# Patient Record
Sex: Female | Born: 1980 | Race: Black or African American | Hispanic: No | Marital: Married | Smoking: Never smoker
Health system: Southern US, Community
[De-identification: ages and names within clinical notes are randomized; demographics above are authoritative.]

## PROBLEM LIST (undated history)

## (undated) DIAGNOSIS — I10 Essential (primary) hypertension: Secondary | ICD-10-CM

## (undated) DIAGNOSIS — J45909 Unspecified asthma, uncomplicated: Secondary | ICD-10-CM

## (undated) DIAGNOSIS — D649 Anemia, unspecified: Secondary | ICD-10-CM

## (undated) DIAGNOSIS — R011 Cardiac murmur, unspecified: Secondary | ICD-10-CM

## (undated) HISTORY — PX: NASAL SEPTUM SURGERY: SHX37

---
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
## 2001-01-24 ENCOUNTER — Emergency Department (HOSPITAL_COMMUNITY): Admission: EM | Admit: 2001-01-24 | Discharge: 2001-01-24 | Payer: Self-pay | Admitting: *Deleted

## 2003-06-29 ENCOUNTER — Emergency Department (HOSPITAL_COMMUNITY): Admission: EM | Admit: 2003-06-29 | Discharge: 2003-06-29 | Payer: Self-pay | Admitting: Emergency Medicine

## 2003-07-03 ENCOUNTER — Emergency Department (HOSPITAL_COMMUNITY): Admission: EM | Admit: 2003-07-03 | Discharge: 2003-07-03 | Payer: Self-pay | Admitting: Emergency Medicine

## 2003-07-08 ENCOUNTER — Inpatient Hospital Stay (HOSPITAL_COMMUNITY): Admission: AD | Admit: 2003-07-08 | Discharge: 2003-07-11 | Payer: Self-pay | Admitting: Obstetrics and Gynecology

## 2003-07-19 ENCOUNTER — Other Ambulatory Visit: Admission: RE | Admit: 2003-07-19 | Discharge: 2003-07-19 | Payer: Self-pay | Admitting: Gynecology

## 2003-07-24 ENCOUNTER — Inpatient Hospital Stay (HOSPITAL_COMMUNITY): Admission: AD | Admit: 2003-07-24 | Discharge: 2003-07-24 | Payer: Self-pay | Admitting: Gynecology

## 2003-07-25 ENCOUNTER — Observation Stay (HOSPITAL_COMMUNITY): Admission: AD | Admit: 2003-07-25 | Discharge: 2003-07-26 | Payer: Self-pay | Admitting: Obstetrics and Gynecology

## 2003-07-29 ENCOUNTER — Inpatient Hospital Stay (HOSPITAL_COMMUNITY): Admission: AD | Admit: 2003-07-29 | Discharge: 2003-07-29 | Payer: Self-pay | Admitting: Gynecology

## 2003-08-11 ENCOUNTER — Inpatient Hospital Stay (HOSPITAL_COMMUNITY): Admission: AD | Admit: 2003-08-11 | Discharge: 2003-08-14 | Payer: Self-pay | Admitting: Gynecology

## 2003-08-13 ENCOUNTER — Encounter (INDEPENDENT_AMBULATORY_CARE_PROVIDER_SITE_OTHER): Payer: Self-pay | Admitting: *Deleted

## 2003-09-03 HISTORY — PX: DILATION AND CURETTAGE OF UTERUS: SHX78

## 2004-01-13 ENCOUNTER — Emergency Department (HOSPITAL_COMMUNITY): Admission: EM | Admit: 2004-01-13 | Discharge: 2004-01-13 | Payer: Self-pay | Admitting: Emergency Medicine

## 2004-01-19 ENCOUNTER — Inpatient Hospital Stay (HOSPITAL_COMMUNITY): Admission: AD | Admit: 2004-01-19 | Discharge: 2004-01-19 | Payer: Self-pay | Admitting: Obstetrics & Gynecology

## 2004-01-27 ENCOUNTER — Observation Stay (HOSPITAL_COMMUNITY): Admission: AD | Admit: 2004-01-27 | Discharge: 2004-01-28 | Payer: Self-pay | Admitting: Family Medicine

## 2004-02-01 ENCOUNTER — Inpatient Hospital Stay (HOSPITAL_COMMUNITY): Admission: AD | Admit: 2004-02-01 | Discharge: 2004-02-01 | Payer: Self-pay | Admitting: Obstetrics and Gynecology

## 2004-02-02 ENCOUNTER — Inpatient Hospital Stay (HOSPITAL_COMMUNITY): Admission: RE | Admit: 2004-02-02 | Discharge: 2004-02-02 | Payer: Self-pay | Admitting: *Deleted

## 2004-02-03 ENCOUNTER — Observation Stay (HOSPITAL_COMMUNITY): Admission: AD | Admit: 2004-02-03 | Discharge: 2004-02-04 | Payer: Self-pay | Admitting: Obstetrics & Gynecology

## 2004-02-05 ENCOUNTER — Ambulatory Visit (HOSPITAL_COMMUNITY): Admission: RE | Admit: 2004-02-05 | Discharge: 2004-02-05 | Payer: Self-pay | Admitting: Interventional Radiology

## 2004-02-19 ENCOUNTER — Inpatient Hospital Stay (HOSPITAL_COMMUNITY): Admission: AD | Admit: 2004-02-19 | Discharge: 2004-02-23 | Payer: Self-pay | Admitting: *Deleted

## 2004-02-27 ENCOUNTER — Inpatient Hospital Stay (HOSPITAL_COMMUNITY): Admission: AD | Admit: 2004-02-27 | Discharge: 2004-02-27 | Payer: Self-pay | Admitting: Obstetrics and Gynecology

## 2004-02-29 ENCOUNTER — Encounter: Admission: RE | Admit: 2004-02-29 | Discharge: 2004-02-29 | Payer: Self-pay | Admitting: *Deleted

## 2004-02-29 ENCOUNTER — Inpatient Hospital Stay (HOSPITAL_COMMUNITY): Admission: AD | Admit: 2004-02-29 | Discharge: 2004-02-29 | Payer: Self-pay | Admitting: Obstetrics and Gynecology

## 2004-03-15 ENCOUNTER — Encounter: Admission: RE | Admit: 2004-03-15 | Discharge: 2004-03-15 | Payer: Self-pay | Admitting: *Deleted

## 2004-03-17 ENCOUNTER — Inpatient Hospital Stay (HOSPITAL_COMMUNITY): Admission: AD | Admit: 2004-03-17 | Discharge: 2004-03-17 | Payer: Self-pay | Admitting: Obstetrics & Gynecology

## 2004-03-18 ENCOUNTER — Inpatient Hospital Stay (HOSPITAL_COMMUNITY): Admission: AD | Admit: 2004-03-18 | Discharge: 2004-03-21 | Payer: Self-pay | Admitting: Family Medicine

## 2004-04-04 ENCOUNTER — Encounter: Admission: RE | Admit: 2004-04-04 | Discharge: 2004-04-04 | Payer: Self-pay | Admitting: *Deleted

## 2004-04-19 ENCOUNTER — Encounter: Admission: RE | Admit: 2004-04-19 | Discharge: 2004-04-19 | Payer: Self-pay | Admitting: Family Medicine

## 2004-04-19 ENCOUNTER — Ambulatory Visit (HOSPITAL_COMMUNITY): Admission: RE | Admit: 2004-04-19 | Discharge: 2004-04-19 | Payer: Self-pay | Admitting: *Deleted

## 2004-04-20 ENCOUNTER — Inpatient Hospital Stay (HOSPITAL_COMMUNITY): Admission: AD | Admit: 2004-04-20 | Discharge: 2004-04-20 | Payer: Self-pay | Admitting: *Deleted

## 2004-05-29 ENCOUNTER — Inpatient Hospital Stay (HOSPITAL_COMMUNITY): Admission: AD | Admit: 2004-05-29 | Discharge: 2004-05-29 | Payer: Self-pay | Admitting: *Deleted

## 2004-06-27 ENCOUNTER — Ambulatory Visit: Payer: Self-pay | Admitting: *Deleted

## 2004-06-28 ENCOUNTER — Ambulatory Visit (HOSPITAL_COMMUNITY): Admission: RE | Admit: 2004-06-28 | Discharge: 2004-06-28 | Payer: Self-pay | Admitting: *Deleted

## 2004-07-03 ENCOUNTER — Inpatient Hospital Stay (HOSPITAL_COMMUNITY): Admission: AD | Admit: 2004-07-03 | Discharge: 2004-07-03 | Payer: Self-pay | Admitting: *Deleted

## 2004-07-29 ENCOUNTER — Inpatient Hospital Stay (HOSPITAL_COMMUNITY): Admission: AD | Admit: 2004-07-29 | Discharge: 2004-07-29 | Payer: Self-pay | Admitting: Family Medicine

## 2004-08-15 ENCOUNTER — Ambulatory Visit: Payer: Self-pay | Admitting: *Deleted

## 2004-08-16 ENCOUNTER — Inpatient Hospital Stay (HOSPITAL_COMMUNITY): Admission: AD | Admit: 2004-08-16 | Discharge: 2004-08-16 | Payer: Self-pay | Admitting: *Deleted

## 2004-08-21 ENCOUNTER — Inpatient Hospital Stay (HOSPITAL_COMMUNITY): Admission: AD | Admit: 2004-08-21 | Discharge: 2004-08-21 | Payer: Self-pay | Admitting: *Deleted

## 2004-08-24 ENCOUNTER — Inpatient Hospital Stay (HOSPITAL_COMMUNITY): Admission: AD | Admit: 2004-08-24 | Discharge: 2004-08-24 | Payer: Self-pay | Admitting: *Deleted

## 2004-08-30 ENCOUNTER — Ambulatory Visit: Payer: Self-pay | Admitting: Family Medicine

## 2004-09-04 ENCOUNTER — Inpatient Hospital Stay (HOSPITAL_COMMUNITY): Admission: AD | Admit: 2004-09-04 | Discharge: 2004-09-04 | Payer: Self-pay | Admitting: *Deleted

## 2004-09-11 ENCOUNTER — Inpatient Hospital Stay (HOSPITAL_COMMUNITY): Admission: AD | Admit: 2004-09-11 | Discharge: 2004-09-13 | Payer: Self-pay | Admitting: Obstetrics & Gynecology

## 2004-09-11 ENCOUNTER — Ambulatory Visit: Payer: Self-pay | Admitting: Obstetrics & Gynecology

## 2004-09-14 ENCOUNTER — Encounter: Admission: RE | Admit: 2004-09-14 | Discharge: 2004-10-14 | Payer: Self-pay | Admitting: Obstetrics & Gynecology

## 2004-10-15 ENCOUNTER — Encounter: Admission: RE | Admit: 2004-10-15 | Discharge: 2004-11-14 | Payer: Self-pay | Admitting: Obstetrics & Gynecology

## 2004-12-13 ENCOUNTER — Encounter: Admission: RE | Admit: 2004-12-13 | Discharge: 2005-01-12 | Payer: Self-pay | Admitting: Obstetrics & Gynecology

## 2005-01-04 ENCOUNTER — Emergency Department (HOSPITAL_COMMUNITY): Admission: EM | Admit: 2005-01-04 | Discharge: 2005-01-04 | Payer: Self-pay | Admitting: Emergency Medicine

## 2005-01-10 ENCOUNTER — Emergency Department (HOSPITAL_COMMUNITY): Admission: EM | Admit: 2005-01-10 | Discharge: 2005-01-10 | Payer: Self-pay | Admitting: Emergency Medicine

## 2005-01-31 ENCOUNTER — Emergency Department (HOSPITAL_COMMUNITY): Admission: EM | Admit: 2005-01-31 | Discharge: 2005-01-31 | Payer: Self-pay | Admitting: Emergency Medicine

## 2005-02-12 ENCOUNTER — Encounter: Admission: RE | Admit: 2005-02-12 | Discharge: 2005-03-14 | Payer: Self-pay | Admitting: Obstetrics & Gynecology

## 2005-04-02 ENCOUNTER — Emergency Department (HOSPITAL_COMMUNITY): Admission: EM | Admit: 2005-04-02 | Discharge: 2005-04-02 | Payer: Self-pay | Admitting: Family Medicine

## 2005-04-14 ENCOUNTER — Encounter: Admission: RE | Admit: 2005-04-14 | Discharge: 2005-05-14 | Payer: Self-pay | Admitting: Obstetrics & Gynecology

## 2005-05-15 ENCOUNTER — Encounter: Admission: RE | Admit: 2005-05-15 | Discharge: 2005-06-13 | Payer: Self-pay | Admitting: Obstetrics & Gynecology

## 2005-06-14 ENCOUNTER — Encounter: Admission: RE | Admit: 2005-06-14 | Discharge: 2005-07-14 | Payer: Self-pay | Admitting: Obstetrics & Gynecology

## 2005-06-16 ENCOUNTER — Emergency Department (HOSPITAL_COMMUNITY): Admission: EM | Admit: 2005-06-16 | Discharge: 2005-06-16 | Payer: Self-pay | Admitting: Emergency Medicine

## 2005-07-15 ENCOUNTER — Encounter: Admission: RE | Admit: 2005-07-15 | Discharge: 2005-08-13 | Payer: Self-pay | Admitting: Obstetrics & Gynecology

## 2005-08-14 ENCOUNTER — Encounter: Admission: RE | Admit: 2005-08-14 | Discharge: 2005-09-13 | Payer: Self-pay | Admitting: Obstetrics & Gynecology

## 2005-08-15 IMAGING — US US OB TRANSVAGINAL MODIFY
1 series · 13 of 28 positions shown · non-contrast
Comparison: none

[Series 1: unknown · 0.25mm/px · 13 of 41 slices shown]
[im 2/41]
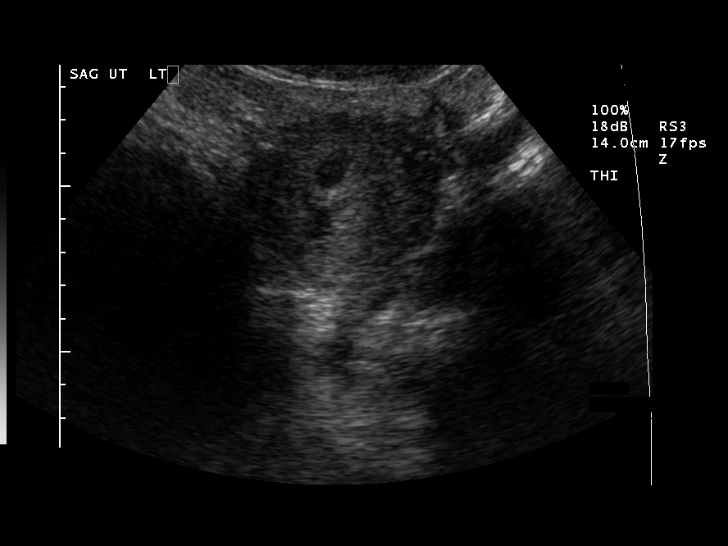
[im 5/41]
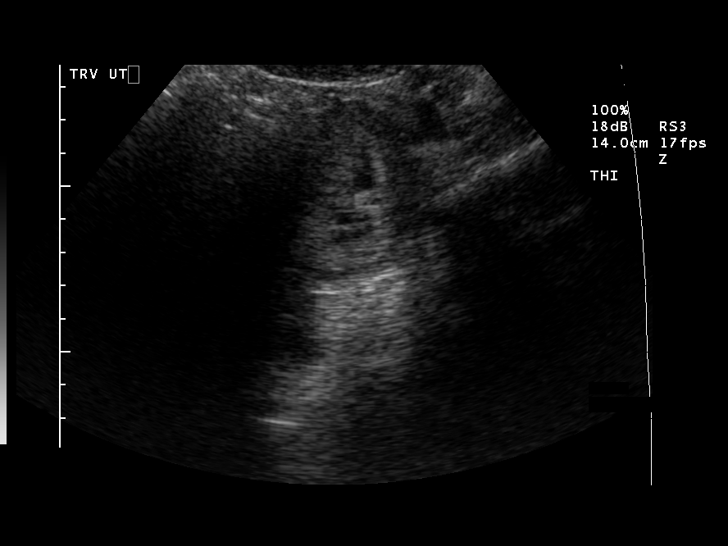
[im 8/41]
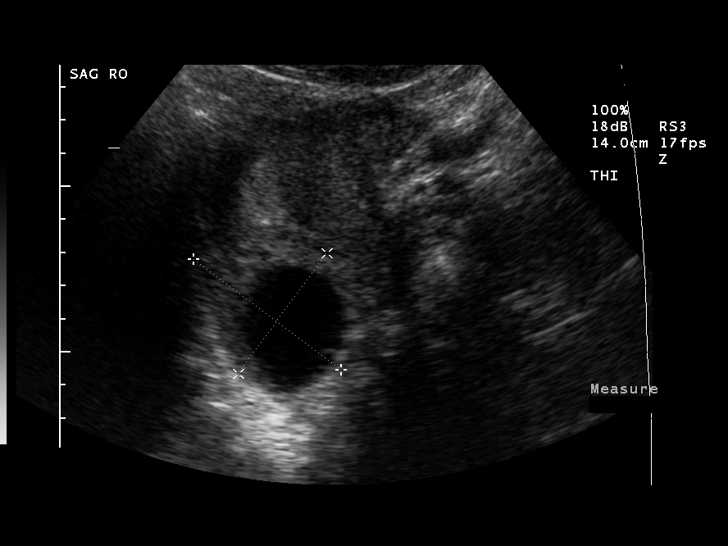
[im 11/41]
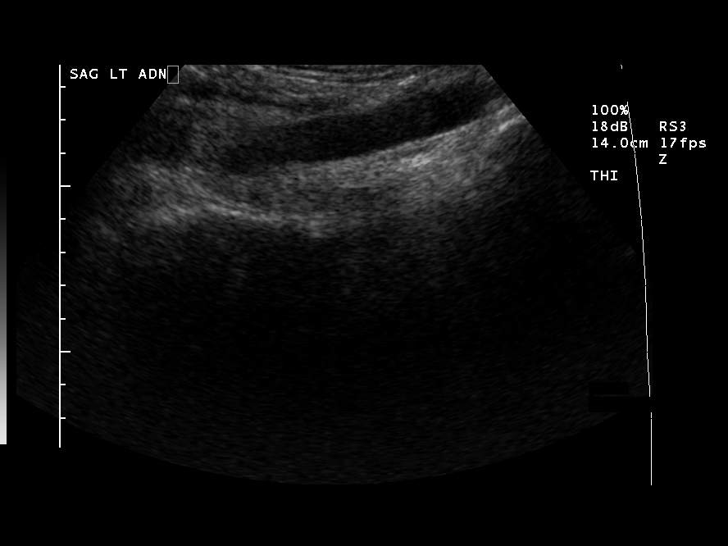
[im 14/41]
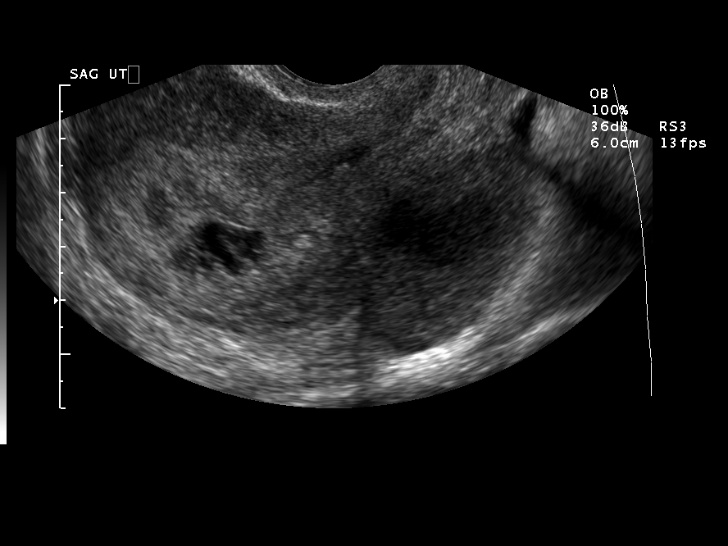
[im 17/41]
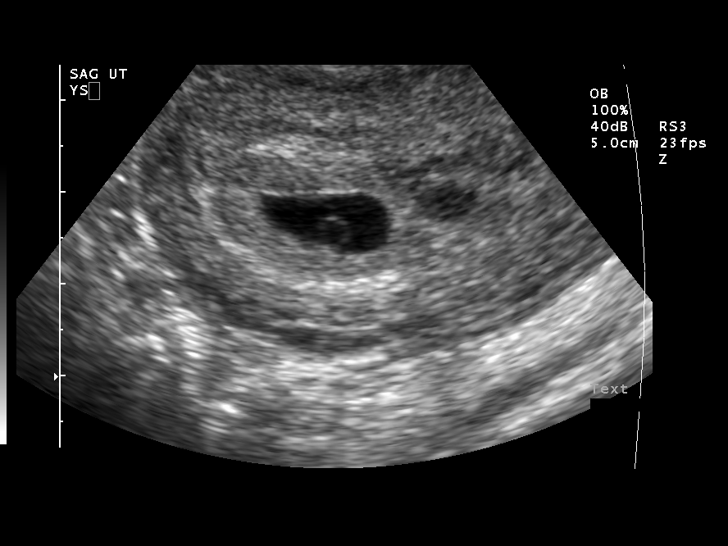
[im 21/41]
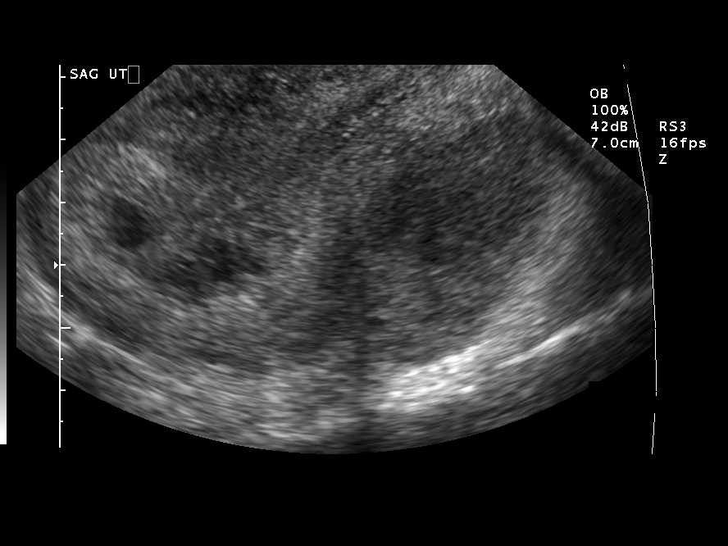
[im 24/41]
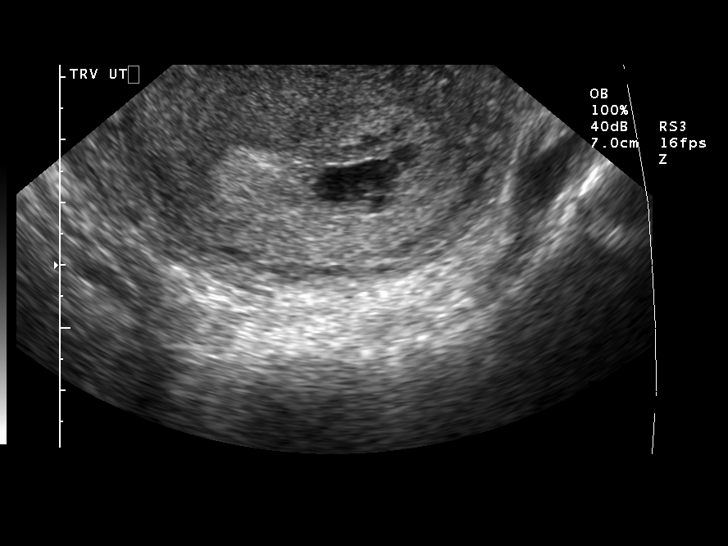
[im 27/41]
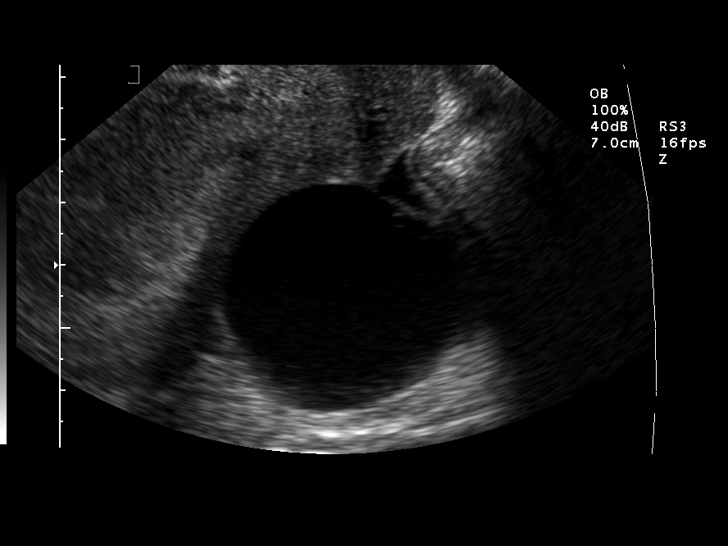
[im 30/41]
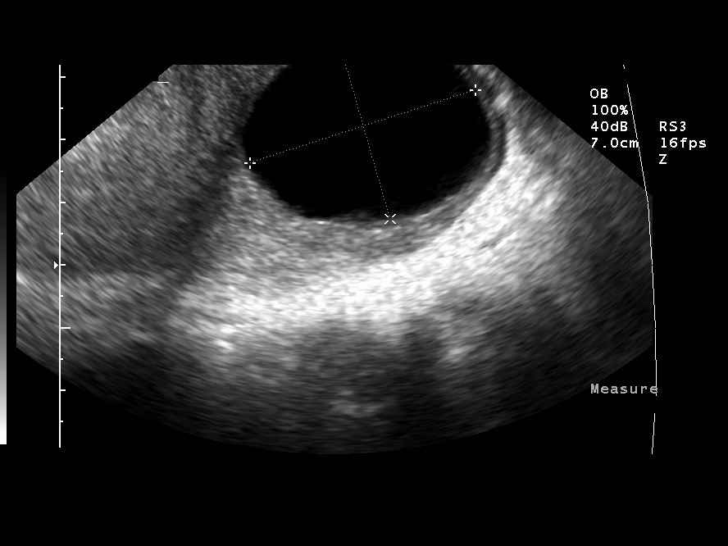
[im 33/41]
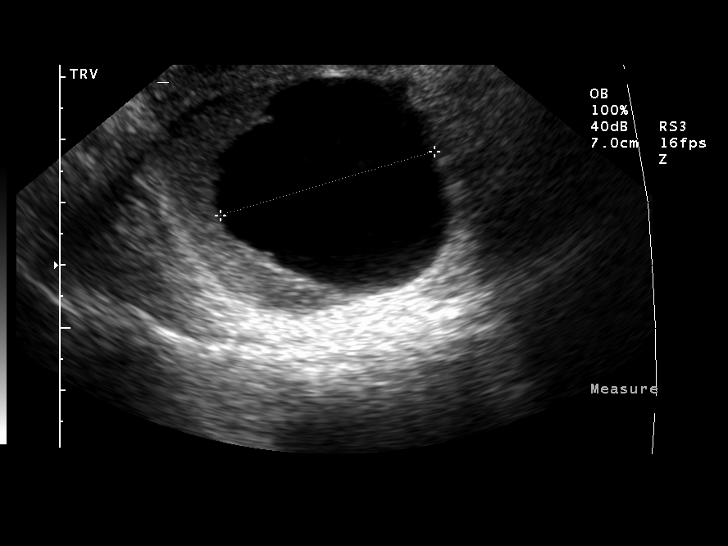
[im 36/41]
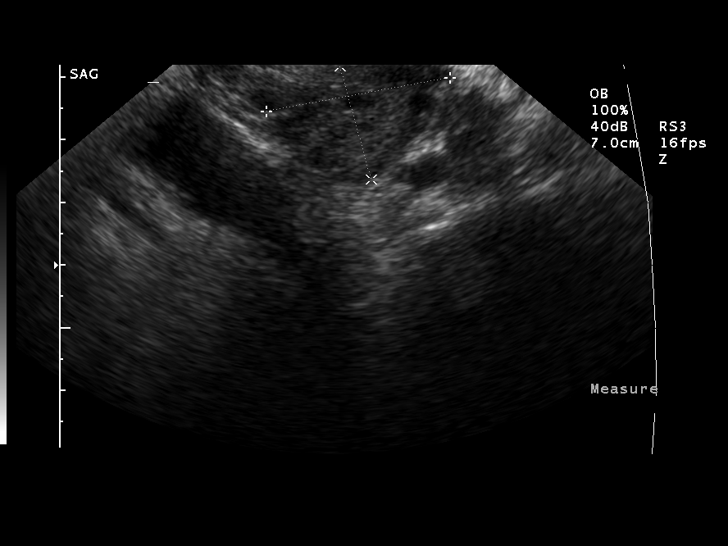
[im 39/41]
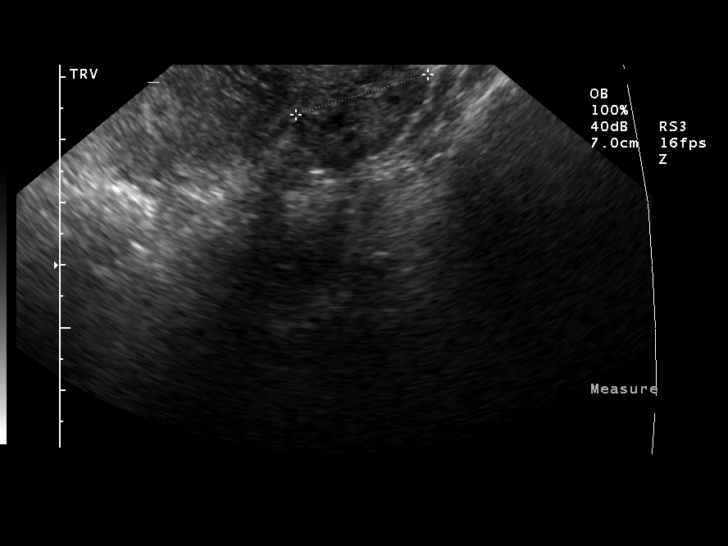

[13 of 28 positions shown; findings below may reference images not displayed]

<!--  IDXRADR:ADDEND:BEGIN -->Addendum Begins<!--  IDXRADR:ADDEND:INNER_BEGIN -->The impression should read 5 weeks 6 day intrauterine gestational sac with yolk sac.

 <!--  IDXRADR:ADDEND:INNER_END -->Addendum Ends
<!--  IDXRADR:ADDEND:END -->Clinical Data: Nausea and vomiting with pain.  
EARLY OBSTETRICAL ULTRASOUND WITH TRANSVAGINAL:
Multiple images of the gravid uterus were obtained using a transabdominal and endovaginal approaches.
There is a single intrauterine pregnancy identified that demonstrates an estimated gestational age by gestational sac size of 5 weeks and 6 days.  A normal appearing yolk sac is seen.  Directly inferior to the gestational sac is an irregular mixed echogenicity collection which contains fluid and irregular soft tissue.  This is felt most likely to represent an old subchorionic hemorrhage with some residual clot.  However, the possibility that this represents an abnormal unresorbed second gestational sac is not excluded with certainty.  No evidence for new subchorionic hemorrhage is seen.
Both ovaries are visualized with the left ovary measuring 2.2 x 3.0 x 1.9 cm and having a normal appearance.  The right ovary measures 5.0 x 5.8 x 3.7 cm and contains a unilocular simple cyst measuring 3.8 x 2.9 x 3.6 cm.  This likely represents an enlarged corpus luteum cyst.  Follow-up evaluation at the time of anatomy scan can be undertaken for initial short term reassessment.  A corpus luteum cyst would be expected to demonstrate resolution by this point.  
IMPRESSION
5 week 6 day living intrauterine pregnancy.  Probable old subchorionic hemorrhage.  Please see above report for full discussion.  
Probable right corpus luteum cyst.  Recommend follow-up evaluation of the right ovary at the time of anatomic exam for initial reassessment of this suspected benign process in the right adnexa.

## 2005-09-12 ENCOUNTER — Emergency Department (HOSPITAL_COMMUNITY): Admission: EM | Admit: 2005-09-12 | Discharge: 2005-09-12 | Payer: Self-pay | Admitting: Family Medicine

## 2005-09-14 ENCOUNTER — Encounter: Admission: RE | Admit: 2005-09-14 | Discharge: 2005-10-14 | Payer: Self-pay | Admitting: Obstetrics & Gynecology

## 2005-09-17 ENCOUNTER — Emergency Department (HOSPITAL_COMMUNITY): Admission: EM | Admit: 2005-09-17 | Discharge: 2005-09-17 | Payer: Self-pay | Admitting: Family Medicine

## 2005-10-15 ENCOUNTER — Encounter: Admission: RE | Admit: 2005-10-15 | Discharge: 2005-11-11 | Payer: Self-pay | Admitting: Obstetrics & Gynecology

## 2005-11-12 ENCOUNTER — Encounter: Admission: RE | Admit: 2005-11-12 | Discharge: 2005-12-12 | Payer: Self-pay | Admitting: Obstetrics & Gynecology

## 2005-12-13 ENCOUNTER — Encounter: Admission: RE | Admit: 2005-12-13 | Discharge: 2006-01-11 | Payer: Self-pay | Admitting: Obstetrics & Gynecology

## 2006-01-12 ENCOUNTER — Encounter: Admission: RE | Admit: 2006-01-12 | Discharge: 2006-02-07 | Payer: Self-pay | Admitting: Obstetrics & Gynecology

## 2006-04-14 ENCOUNTER — Emergency Department (HOSPITAL_COMMUNITY): Admission: EM | Admit: 2006-04-14 | Discharge: 2006-04-15 | Payer: Self-pay | Admitting: Emergency Medicine

## 2006-04-18 ENCOUNTER — Inpatient Hospital Stay (HOSPITAL_COMMUNITY): Admission: AD | Admit: 2006-04-18 | Discharge: 2006-04-18 | Payer: Self-pay | Admitting: Obstetrics & Gynecology

## 2006-11-11 ENCOUNTER — Emergency Department (HOSPITAL_COMMUNITY): Admission: EM | Admit: 2006-11-11 | Discharge: 2006-11-11 | Payer: Self-pay | Admitting: Emergency Medicine

## 2008-01-15 ENCOUNTER — Emergency Department (HOSPITAL_COMMUNITY): Admission: EM | Admit: 2008-01-15 | Discharge: 2008-01-15 | Payer: Self-pay | Admitting: Emergency Medicine

## 2008-01-18 ENCOUNTER — Inpatient Hospital Stay (HOSPITAL_COMMUNITY): Admission: AD | Admit: 2008-01-18 | Discharge: 2008-01-22 | Payer: Self-pay | Admitting: Obstetrics & Gynecology

## 2008-01-18 ENCOUNTER — Ambulatory Visit: Payer: Self-pay | Admitting: Obstetrics & Gynecology

## 2008-01-25 ENCOUNTER — Inpatient Hospital Stay (HOSPITAL_COMMUNITY): Admission: AD | Admit: 2008-01-25 | Discharge: 2008-01-25 | Payer: Self-pay | Admitting: Obstetrics & Gynecology

## 2008-01-26 ENCOUNTER — Ambulatory Visit: Payer: Self-pay | Admitting: Obstetrics & Gynecology

## 2008-01-26 ENCOUNTER — Inpatient Hospital Stay (HOSPITAL_COMMUNITY): Admission: AD | Admit: 2008-01-26 | Discharge: 2008-01-26 | Payer: Self-pay | Admitting: Obstetrics & Gynecology

## 2008-02-01 ENCOUNTER — Ambulatory Visit: Payer: Self-pay | Admitting: Obstetrics & Gynecology

## 2008-02-01 ENCOUNTER — Encounter: Payer: Self-pay | Admitting: Obstetrics & Gynecology

## 2008-02-05 ENCOUNTER — Encounter: Admission: RE | Admit: 2008-02-05 | Discharge: 2008-02-05 | Payer: Self-pay | Admitting: Obstetrics & Gynecology

## 2008-02-18 ENCOUNTER — Ambulatory Visit: Payer: Self-pay | Admitting: Obstetrics & Gynecology

## 2008-02-23 ENCOUNTER — Inpatient Hospital Stay (HOSPITAL_COMMUNITY): Admission: AD | Admit: 2008-02-23 | Discharge: 2008-02-23 | Payer: Self-pay | Admitting: Obstetrics & Gynecology

## 2008-02-23 ENCOUNTER — Ambulatory Visit: Payer: Self-pay | Admitting: Obstetrics & Gynecology

## 2008-02-26 ENCOUNTER — Ambulatory Visit: Payer: Self-pay | Admitting: *Deleted

## 2008-02-26 ENCOUNTER — Inpatient Hospital Stay (HOSPITAL_COMMUNITY): Admission: AD | Admit: 2008-02-26 | Discharge: 2008-02-29 | Payer: Self-pay | Admitting: Obstetrics & Gynecology

## 2008-06-14 ENCOUNTER — Ambulatory Visit: Payer: Self-pay | Admitting: Family Medicine

## 2008-06-14 ENCOUNTER — Inpatient Hospital Stay (HOSPITAL_COMMUNITY): Admission: AD | Admit: 2008-06-14 | Discharge: 2008-06-14 | Payer: Self-pay | Admitting: Obstetrics & Gynecology

## 2008-06-30 ENCOUNTER — Ambulatory Visit: Payer: Self-pay | Admitting: Obstetrics & Gynecology

## 2008-06-30 LAB — CONVERTED CEMR LAB
AST: 11 units/L (ref 0–37)
Alkaline Phosphatase: 62 units/L (ref 39–117)
BUN: 8 mg/dL (ref 6–23)
Chloride: 105 meq/L (ref 96–112)
Glucose, Bld: 87 mg/dL (ref 70–99)
Platelets: 213 10*3/uL (ref 150–400)
RDW: 14.2 % (ref 11.5–15.5)
Sodium: 137 meq/L (ref 135–145)
Total Bilirubin: 0.5 mg/dL (ref 0.3–1.2)
WBC: 8.6 10*3/uL (ref 4.0–10.5)

## 2008-07-07 ENCOUNTER — Ambulatory Visit (HOSPITAL_COMMUNITY): Admission: RE | Admit: 2008-07-07 | Discharge: 2008-07-07 | Payer: Self-pay | Admitting: Obstetrics and Gynecology

## 2008-07-21 ENCOUNTER — Ambulatory Visit: Payer: Self-pay | Admitting: Obstetrics & Gynecology

## 2008-07-21 ENCOUNTER — Encounter: Payer: Self-pay | Admitting: Obstetrics & Gynecology

## 2008-07-21 LAB — CONVERTED CEMR LAB
Iron: 67 ug/dL (ref 42–145)
TIBC: 425 ug/dL (ref 250–470)
UIBC: 358 ug/dL
Vitamin B-12: 186 pg/mL — ABNORMAL LOW (ref 211–911)

## 2008-08-03 ENCOUNTER — Ambulatory Visit: Payer: Self-pay | Admitting: Obstetrics & Gynecology

## 2008-08-03 LAB — CONVERTED CEMR LAB
Chlamydia, DNA Probe: NEGATIVE
GC Probe Amp, Genital: NEGATIVE

## 2008-08-05 ENCOUNTER — Inpatient Hospital Stay (HOSPITAL_COMMUNITY): Admission: AD | Admit: 2008-08-05 | Discharge: 2008-08-05 | Payer: Self-pay | Admitting: Obstetrics & Gynecology

## 2008-08-07 ENCOUNTER — Ambulatory Visit: Payer: Self-pay | Admitting: Obstetrics and Gynecology

## 2008-08-07 ENCOUNTER — Inpatient Hospital Stay (HOSPITAL_COMMUNITY): Admission: AD | Admit: 2008-08-07 | Discharge: 2008-08-07 | Payer: Self-pay | Admitting: Family Medicine

## 2008-08-10 ENCOUNTER — Ambulatory Visit: Payer: Self-pay | Admitting: Obstetrics & Gynecology

## 2008-08-10 ENCOUNTER — Inpatient Hospital Stay (HOSPITAL_COMMUNITY): Admission: AD | Admit: 2008-08-10 | Discharge: 2008-08-12 | Payer: Self-pay | Admitting: Obstetrics & Gynecology

## 2008-08-15 ENCOUNTER — Inpatient Hospital Stay (HOSPITAL_COMMUNITY): Admission: AD | Admit: 2008-08-15 | Discharge: 2008-08-15 | Payer: Self-pay | Admitting: Obstetrics & Gynecology

## 2008-09-03 ENCOUNTER — Inpatient Hospital Stay (HOSPITAL_COMMUNITY): Admission: AD | Admit: 2008-09-03 | Discharge: 2008-09-06 | Payer: Self-pay | Admitting: Obstetrics & Gynecology

## 2008-09-03 ENCOUNTER — Ambulatory Visit: Payer: Self-pay | Admitting: Physician Assistant

## 2009-06-17 ENCOUNTER — Emergency Department (HOSPITAL_COMMUNITY): Admission: EM | Admit: 2009-06-17 | Discharge: 2009-06-17 | Payer: Self-pay | Admitting: Family Medicine

## 2009-08-15 IMAGING — US US ABDOMEN COMPLETE
1 series · 14 of 25 positions shown · non-contrast
Comparison: April 18, 2006

CLINICAL DATA: Right upper quadrant pain; 6 weeks pregnant

ABDOMEN ULTRASOUND
TECHNIQUE: Complete abdominal ultrasound examination was performed
including evaluation of the liver, gallbladder, bile ducts,
pancreas, kidneys, spleen, IVC, and abdominal aorta.

[Series 1: us abdomen complete · 14 of 78 slices shown]
[im 1/78]
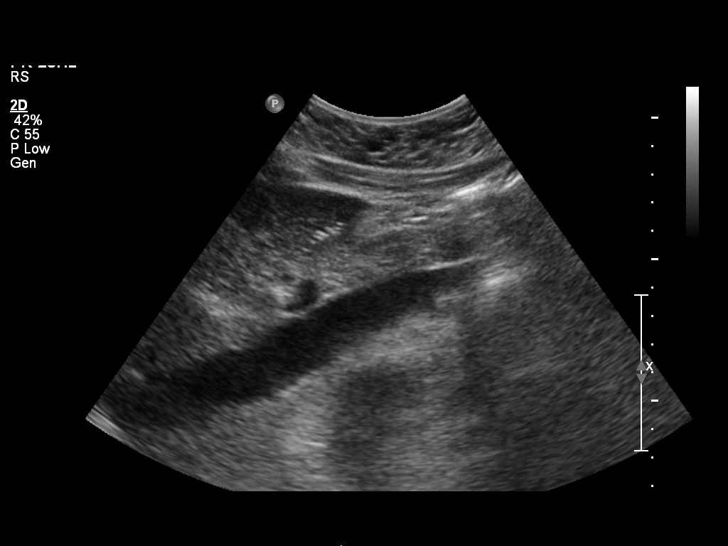
[im 7/78]
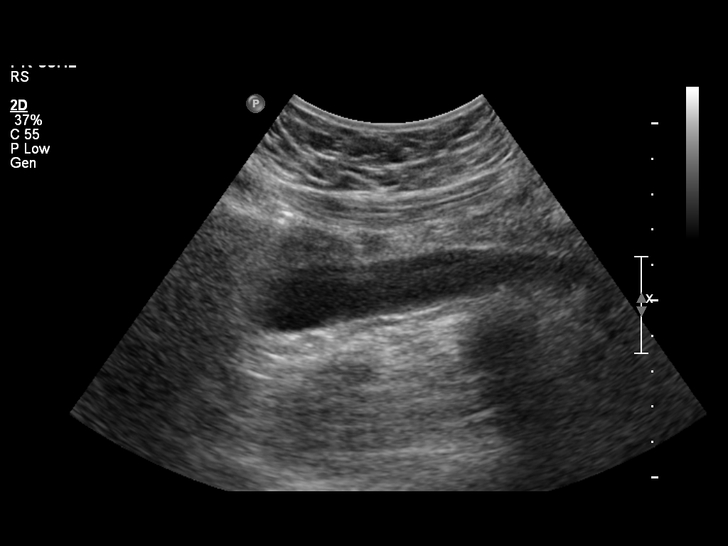
[im 13/78]
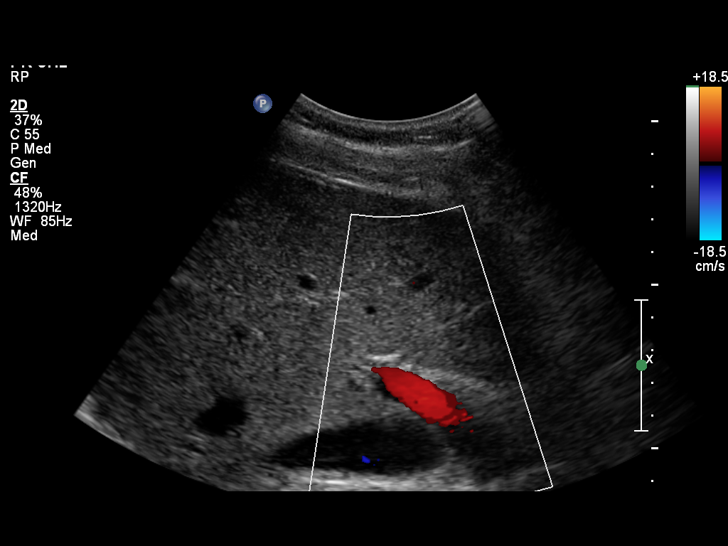
[im 20/78]
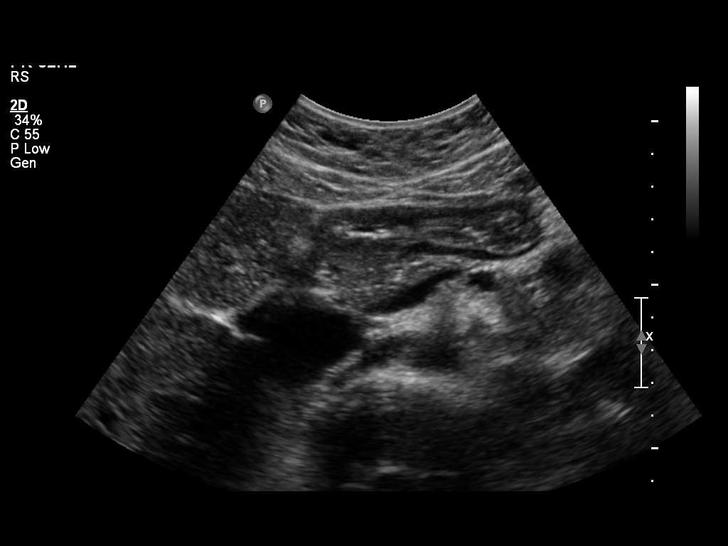
[im 26/78]
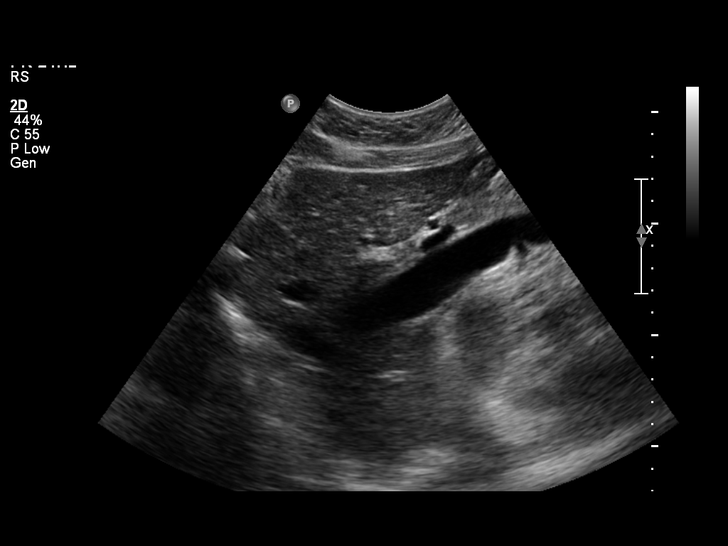
[im 29/78]
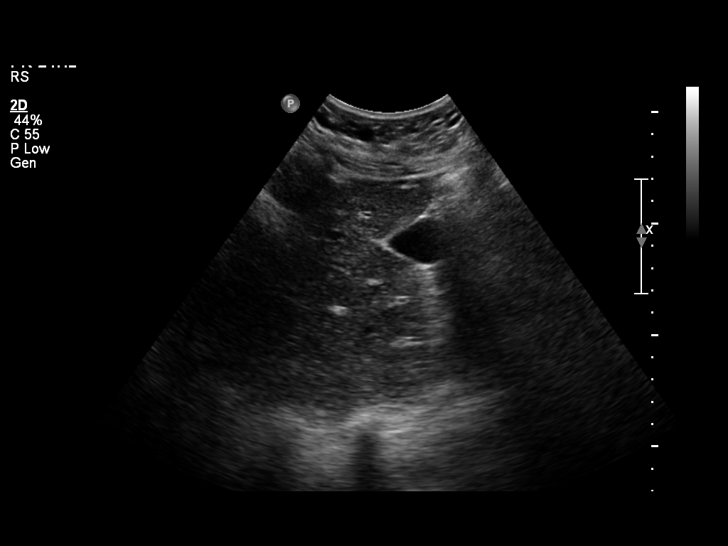
[im 36/78]
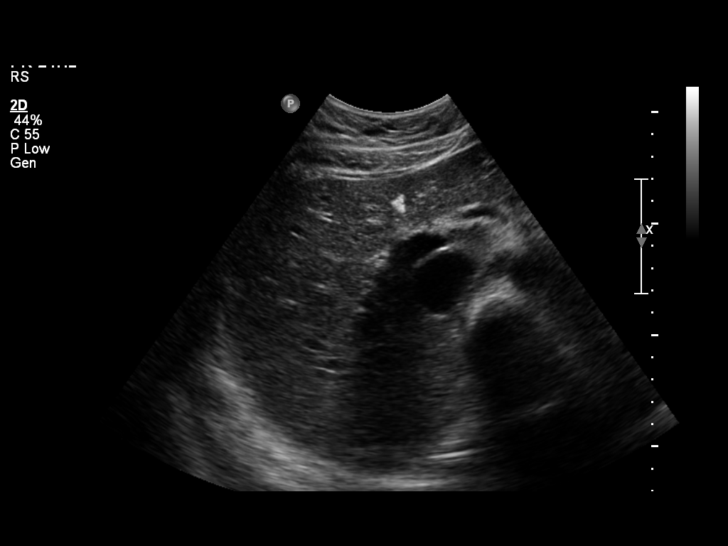
[im 42/78]
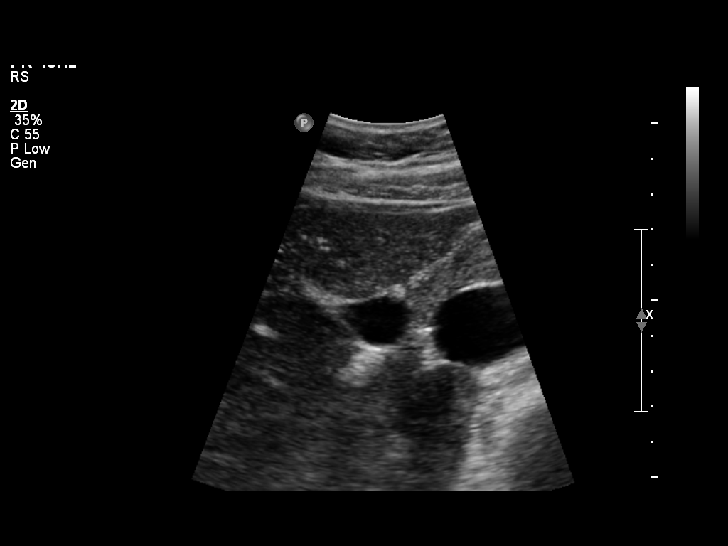
[im 49/78]
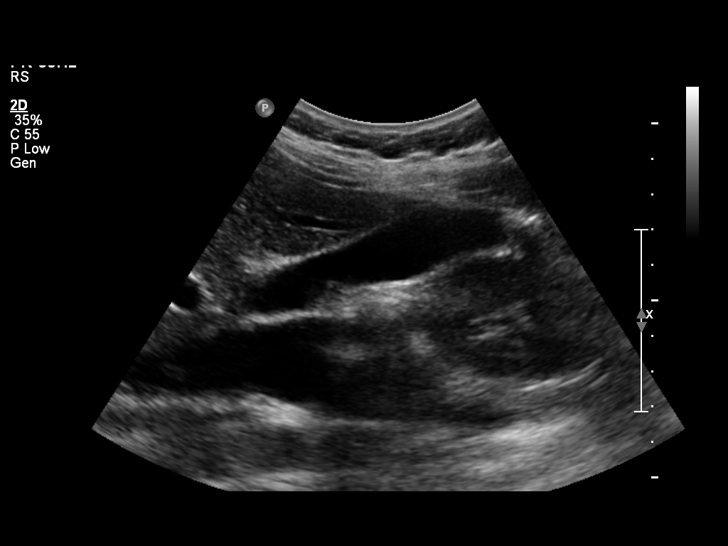
[im 52/78]
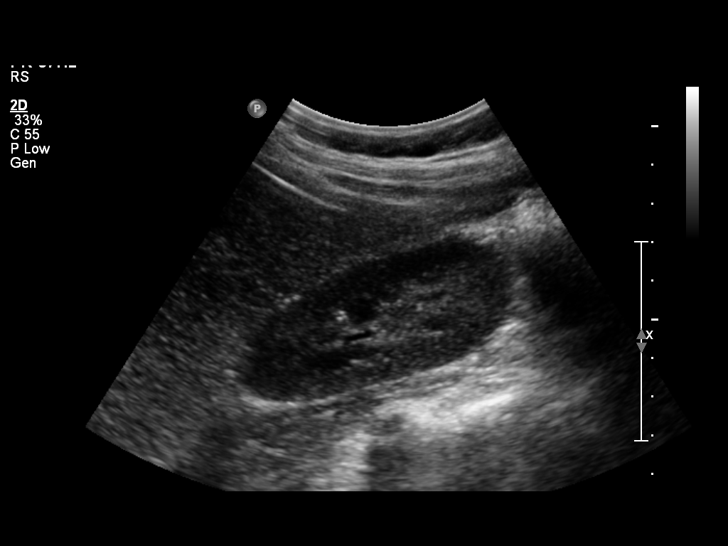
[im 58/78]
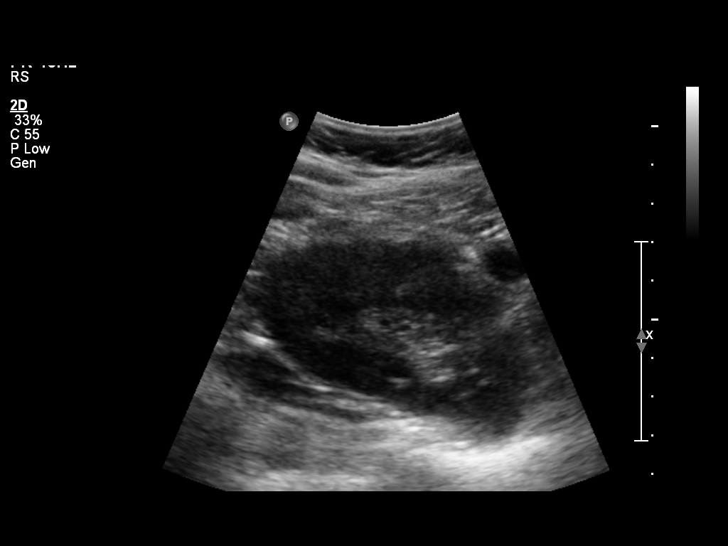
[im 65/78]
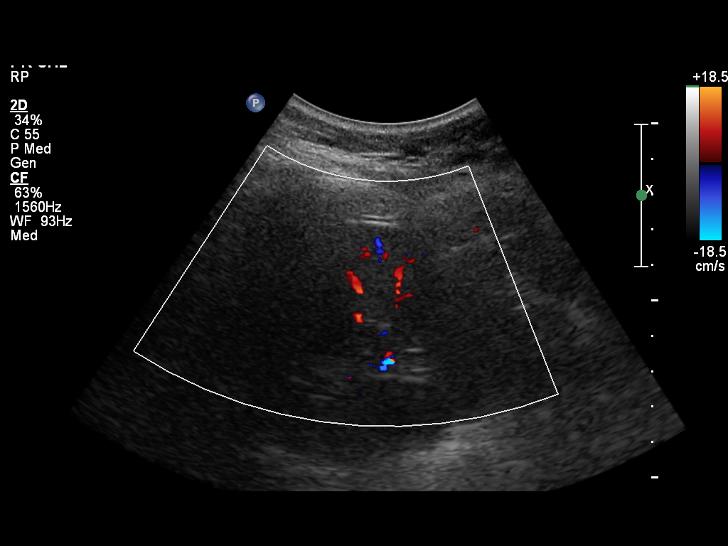
[im 71/78]
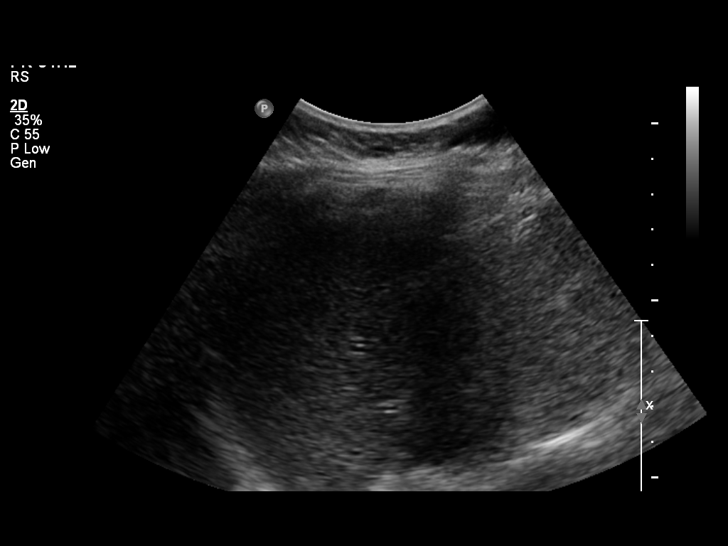
[im 78/78]
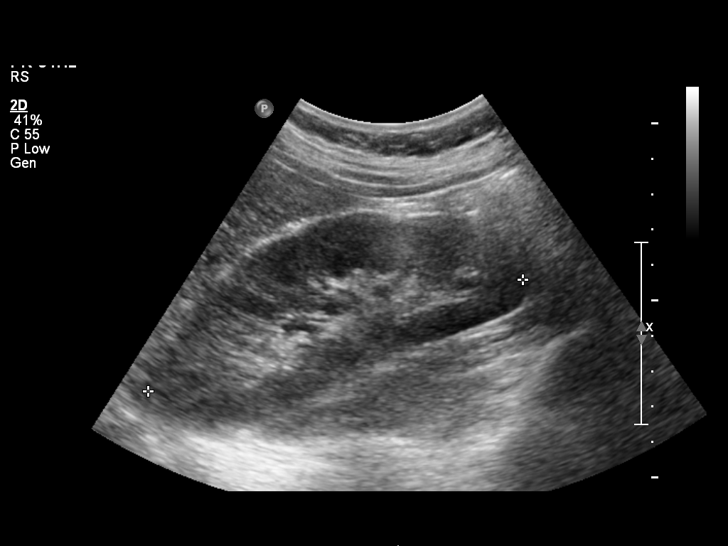

[14 of 25 positions shown; findings below may reference images not displayed]

FINDINGS: The gallbladder has a normal appearance with no evidence
of stones, wall thickening, or pericholecystic fluid.  The
gallbladder is the location of the patient's tenderness during the
exam, however.  The common duct is normal measuring 3 mm in width.
There is no intrahepatic biliary ductal dilatation.  The liver,
spleen, IVC, abdominal aorta, kidneys, and visualized portion of
the pancreas are unremarkable.  There is no ascites.
IMPRESSION: Normal abdominal ultrasound.

## 2010-03-15 ENCOUNTER — Emergency Department (HOSPITAL_COMMUNITY): Admission: EM | Admit: 2010-03-15 | Discharge: 2010-03-15 | Payer: Self-pay | Admitting: Family Medicine

## 2010-09-23 ENCOUNTER — Encounter: Payer: Self-pay | Admitting: *Deleted

## 2010-09-23 ENCOUNTER — Encounter: Payer: Self-pay | Admitting: Obstetrics & Gynecology

## 2010-12-06 LAB — POCT URINALYSIS DIP (DEVICE)
Bilirubin Urine: NEGATIVE
Glucose, UA: NEGATIVE mg/dL

## 2010-12-17 LAB — CBC
MCHC: 32.4 g/dL (ref 30.0–36.0)
Platelets: 184 10*3/uL (ref 150–400)
WBC: 7.9 10*3/uL (ref 4.0–10.5)

## 2011-01-15 NOTE — Discharge Summary (Signed)
[name], [name]                 ACCOUNT NO.:  [id number]   MEDICAL RECORD NO.:  [id number]          PATIENT TYPE:  INP   LOCATION:  9148                          FACILITY:  [facility]   PHYSICIAN:  Tilda Burrow, M.D. DATE OF BIRTH:  17-Mar-1981   DATE OF ADMISSION:  08/10/2008  DATE OF DISCHARGE:  08/12/2008                               DISCHARGE SUMMARY   ADMITTING DIAGNOSES:  1. Pregnancy at 36 weeks 5 days.  2. Persistent nausea and vomiting.  3. Mild dehydration.  4. Anemia of pregnancy.  5. Hypokalemia.   DISCHARGE DIAGNOSES:  1. Pregnancy at 37 weeks, undelivered.  2. Anemia of pregnancy.  3. Dehydration, resolved.  4. Hypokalemia, improved.   DISCHARGE MEDICATIONS:  1. Prenatal vitamins one p.o. daily.  2. Iron sulfate 325 mg p.o. b.i.d.  3. Reglan 10 mg p.o. a.c., 30 tablets, refill x2.  4. Phenergan 25 to 50 mg p.o. q.6 h. p.r.n. nausea.   HOSPITAL SUMMARY:  This 30 year old gravida 3, para 2-0-0-2, at 73  weeks' gestation, was admitted unable to take p.o. fluids and with  perceived mild dehydration.  She was admitted with hemoglobin 8.6,  hematocrit 26.5, mild hypokalemia of 3.0.  She had urinalysis showing  specific gravity of 1.020 with greater than 80 mg per dL ketonuria and  1+ proteinuria.  She was admitted for fluid hydration and evaluation.  She had TSH that was normal.  H. pylori, which is still pending.  Thyroid function tests were normal at 1.470.  She received IV fluid  hydration for 36 hours, received Phenergan 25 mg IV every 6 hours for  p.r.n. nausea along with Zofran every 4 hours p.r.n.  Additional labs  include amylase, lipase, which were normal.  She improved in her p.o.  intake and she felt nauseated and she did not throw up on post hospital  day #2, and was discharged on August 12, 2008, well hydrated, voiding  with nontender abdomen, gravid uterus with no evidence of labor for  followup in her scheduled appointment on August 19, 2008.      Tilda Burrow, M.D.  Electronically Signed     JVF/MEDQ  D:  08/12/2008  T:  08/12/2008  Job:  [id number]

## 2011-01-15 NOTE — Discharge Summary (Signed)
[name], [name]                 ACCOUNT NO.:  [id number]   MEDICAL RECORD NO.:  [id number]          PATIENT TYPE:  INP   LOCATION:  9308                          [facility]ILITY:  [facility]   PHYSICIAN:  Norton Blizzard, MD    DATE OF BIRTH:  [birth year]   DATE OF ADMISSION:  01/18/2008  DATE OF DISCHARGE:  01/22/2008                               DISCHARGE SUMMARY   ADMISSION DIAGNOSES:  1. Intrauterine pregnancy at 7 weeks.  2. Hyperemesis gravidarum.   DISCHARGE DIAGNOSES:  1. Intrauterine pregnancy at 7 4/7 weeks.  2. Hyperemesis gravidarum.  3. Dobbhoff tube in place.   PROCEDURES:  Dobbhoff tube placement on Jan 21, 2008.   CONSULTS:  1. Nutrition.  2. Case management.   DATING CRITERIA:  Patient's LMP is 12/07/07, but she is dated by a  ultrasound done 01/15/08 giving her an EDC of 09/05/2008, which is not  consistent with her LMP.   HISTORY OF PRESENT ILLNESS:  The patient is a 30 year old G3, P 1-0-1-1,  who presented at [redacted] weeks gestation with hyperemesis gravidarum.  The  patient had a history just prior to her pregnancy and had a PICC line  during this time that got infected.  During this pregnancy, she reported  vomiting 5-6 times a day which has not been alleviated with Zofran.  On  initial evaluation, the patient was noted to have stable electrolytes.  However, her ketones were notable for being more than 80 on urinalysis.  The patient was also unable to tolerate any liquids, and was  hospitalized for further management.   BRIEF HOSPITAL COURSE:  The patient was admitted and started on a myriad  of antiemetics including Zofran, Phenergan, Reglan, scopolamine patch,  glycopyrrolate and Prilosec.  The patient's nausea and vomiting was not  ameliorated with these medications, and she was also started on a  methylprednisolone taper which did not help her symptoms.  Given her  nonresponse to medical therapy, the decision was made to proceed with a  feeding tube placement.  A  Dobbhoff tube was placed on Jan 21, 2008,  without difficulty.  Nutrition was consulted, and they recommended  giving tube feeds in the form of Jevity 1.2 with a goal of 63 mL per  hour, to provide 1800 kcal with 83 g of protein and 1211 mL of free  water.  The patient tolerated tube feeds okay, and had no further  significant nausea or vomiting after the tube was placed.   PERTINENT DISCHARGE LABS:  A lipase of 51 and amylase of 201.  Of note,  the patient did have right upper quadrant ultrasound that was done on  Jan 19, 2008, which showed normal gallbladder, normal pancreas, and no  other abdominal abnormalities.  Other pertinent labs included a  potassium of 3.3, creatinine of 0.6, TSH 0.08, a white blood cell count  of 7.8, and hemoglobin of 11.  H. pylori negative.  The patient is to be  discharged today with orders for home health instructions given her tube  feeds.   DISCHARGE CONDITION:  Stable.   DISCHARGE  MEDICATIONS:  The patient has a methylprednisolone taper and  this is the standard order for hyperemesis gravidarum patient.  She was  told to use as directed as per the taper protocol.  She also has Zofran  4 mg one tab p.o. q.4-6 h. p.r.n. nausea, Phenergan 25 mg per rectum q.6  h. p.r.n. nausea, Reglan 10 mg p.o. t.i.d. before meals, glycopyrrolate  1 mg p.o. t.i.d., and Prilosec 20 mg p.o. daily.   DISCHARGE INSTRUCTIONS:  The patient was given the antenatal discharge  sheet and told to call for any concerning symptoms.  She will be  followed by [facility] and the orders given to  them were as follows:  1. Home Health RN as needed for tube feeding care/family      education/supplied.  2. The formula is Jevity 1.2 at 63 mL per hour; this is a continuous      feed.  3. Flush with 200 mL of water four times a day.  4. The patient's feeding tube can be clamped during transport to home.      The Home Health RN is to restart feeds with the patient  gets home.  5. The patient will have a comprehensive metabolic panel which will be      rechecked during her clinic visit that is already scheduled on February 01, 2008, at 8:45 a.m.  The patient was also told to call for any      other obstetrical concerns or any other concerns.      Norton Blizzard, MD  Electronically Signed     UAD/MEDQ  D:  01/22/2008  T:  01/23/2008  Job:  [id number]

## 2011-01-15 NOTE — Op Note (Signed)
[name], [name]                 ACCOUNT NO.:  [id number]   MEDICAL RECORD NO.:  [id number]          PATIENT TYPE:  INP   LOCATION:                                [facility]   PHYSICIAN:  Allie Bossier, MD        DATE OF BIRTH:  [birth year]   DATE OF PROCEDURE:  DATE OF DISCHARGE:                               OPERATIVE REPORT   PREOPERATIVE DIAGNOSIS:  Multiparity, desires sterility.   POSTOPERATIVE DIAGNOSIS:  Multiparity, desires sterility.   PROCEDURES:  Bilateral sterilization procedure, application of Filshie  clip.   SURGEON:  Allie Bossier, MD   ANESTHESIA:  Angelica Pou, MD, epidural.   COMPLICATIONS:  None.   ESTIMATED BLOOD LOSS:  Minimal.   SPECIMENS:  None.   FINDINGS:  Normal appearing tubes.   DETAILED PROCEDURE AND FINDINGS:  Preoperatively, the risks of surgery  were explained and accepted.  She understands the failure rate and  declines alternative forms of birth control.  She also understands the  permanence of this procedure.  She was taken to the operating room.  Epidural was bolused for surgery.  Her abdomen was prepped and draped in  usual sterile fashion.  After adequate anesthesia was assured, her  umbilicus was inspected.  I made a transverse incision in the umbilicus  to provide excellent cosmetic result.  The peritoneum was entered.  The  fascial incision was extended with Mayo.  Using [employer] retractors, I  was able to visualize the oviduct on the patient's left.  I traced it  with a Babcock clamp to the fimbriated end.  I then retraced to the  isthmic region where the piece of tube was ligated with a Filshie clip.  The Filshie clip was placed across the entire oviduct.  I then injected  1 mL of 0.5% Marcaine just distal to the clamp for postop pain relief.  A repeat procedure was performed on the right side.  Again, the fimbria  was visualized and the clamp was placed in the isthmic region with a  millimeter of 0.5% Marcaine for postop  pain relief.  The fascia was then  elevated with Allis clamps and closed with a 0 Vicryl running nonlocking  suture.  No defects were palpable.  The subcutaneous tissue was  infiltrated with 0.5% Marcaine.  A subcuticular closure was done with 4-  0 Vicryl.  She was taken to recovery room in stable condition.  Her  bladder was emptied post procedure with a Robinson catheter.     Allie Bossier, MD  Electronically Signed    MCD/MEDQ  D:  09/05/2008  T:  09/06/2008  Job:  [id number]

## 2011-01-15 NOTE — Consult Note (Signed)
[name], [name]                 ACCOUNT NO.:  [id number]   MEDICAL RECORD NO.:  [id number]          PATIENT TYPE:  OBV   LOCATION:  9304                          [facility]ILITY:  [facility]   PHYSICIAN:  Bernette Redbird, M.D.   DATE OF BIRTH:  May 18, 1981   DATE OF CONSULTATION:  02/28/2008  DATE OF DISCHARGE:                                 CONSULTATION   Dr. Penne Lash of the [facility] faculty practice asked [place] to see this 30 year old  African American female who is about [redacted] weeks pregnant, because of  elevated amylase and lipase in the setting of hyperemesis gravidarum.   This is not a new problem for [name], who had similar problems with her  initial pregnancy back in 2004, at which time she also had hyperemesis  and had a lipase of 365 with an amylase of 640.  With time, and  presumably with hydration, the patient's lipase came down to 134, but  never was completely normal.  During a subsequent pregnancies, which  ended in abortion in 2007, she had a lipase of 78 with an amylase of 249  and on the current pregnancy, lipase has been 85 with an amylase of 240.  More recently, the lipase bumped slightly to 89, then to 132, then to 82  and her amylase got as high as 403.   There are no obvious causes for pancreatitis in this patient.  Her  triglycerides are normal.  She denies ethanol abuse.  Liver chemistries  are normal and an ultrasound shows no evidence of gallstones or biliary  ductal dilatation.   Moreover, the patient does not have typical findings of pancreatitis  clinically, in that she has no upper abdominal pain.  She does have  the  nausea and vomiting, she also has infraumbilical abdominal discomfort,  and low back pain, but nothing that would really be typical for  pancreatitis in terms of symptoms other than the nausea and vomiting.   The patient indicates that, when not pregnant, she has normal GI tract  function.  Specifically, there is no problem with poor appetite or  involuntary  weight loss (although she indicates she has lost about 27  pounds with this pregnancy), no trouble swallowing.  No heartburn  symptoms (although she is having some during this pregnancy), no chronic  abdominal pain, nausea or vomiting, and no lower tract symptoms such as  constipation, diarrhea, or rectal bleeding.   PAST MEDICAL HISTORY:  No known allergies.   OUTPATIENT MEDICATIONS:  Phenergan and Tylenol.   OPERATIONS:  No abdominal surgery.   CHRONIC MEDICAL ILLNESSES:  None, apart from the history of hyperemesis.  No history of asthma, ulcers, or diabetes.   HABITS:  Nonsmoker and nondrinker.   FAMILY HISTORY:  Possible colon cancer in her paternal grandmother.  No  definite family history of pancreatitis.  The patient thought that  possibly her sister had pancreatitis at age 65, but this history sounded  rather vague and seems unlikely in view of the relatives young age.   SOCIAL HISTORY:  The patient lives with her father and her daughter.  REVIEW OF SYSTEMS:  See HPI.  Apart from hyperemesis and heartburn  associated with pregnancy, negative for GI symptoms.   PHYSICAL EXAMINATION:  GENERAL:  A quiet, almost withdrawn, but  cooperative [place] American female with tattoos, lying in bed in no  acute distress.  HEENT: She is anicteric.  There might be some mild pallor, nothing  impressive.  CHEST:  Clear to auscultation.  HEART:  Heart has a soft flow murmur at the upper left sternal border, I  believe, but no gallops or arrhythmias are appreciated.  ABDOMEN:  Somewhat adipose.  Bowel sounds are normal.  It is very soft and there  is no upper abdominal mass effect or tenderness and no organomegaly is  appreciated of the liver or spleen.   LABORATORY DATA:  Labs on this admission include white count 9800 two  days ago and 6400 today, hemoglobin 12.2 at time of admission and today  it is 8.9 following hydration, and platelets 155,000.  Chemistry panel  on admission  showed BUN of 11 with creatinine of 0.7, following  hydration BUN is 3, creatinine is 0.6, and potassium was 3.8 on  admission, fell to 2.9 and now it is up to 3.2.  Bicarb was somewhat low  on admission at 12, but now is up to 23.  Liver chemistries are within  normal limits apart from a bilirubin of 1.3 on admission (1.1 now),  albumin is 2.6 following hydration as compared to 4.0 on admission.  Admission amylase was 209, it then rose to 403, and today it is 232.  Lipase was 89 on admission, rose to 132, and today it is 82.  Triglyceride level normal at 57.  Urinalysis was positive for ketones on  admission.  Abdominal ultrasound that showed a normal pancreas, normal  gallbladder, and normal common bile duct.   IMPRESSION:  Hyperamylasemia and hyperlipasemia, recurrent, in  association with pregnancy, but without clinical or radiographic  evidence of pancreatitis at this time.  She has never been known to have  a normal amylase and on only one occasion a normal lipase, but it has  not been checked except during times of hyperemesis.   Her admission blood work and subsequent blood work shows changes  consistent with significant dehydration at time of admission, as  evidenced by significant drops in BUN, hemoglobin, and albumin following  the admission and hydration.  This makes me wonder if an element of the  hyperamylasemia and hyperlipasemia may be due to impaired renal  filtration.   It does sound as though the patient is having a bit of gestational  reflux symptomatology.   RECOMMENDATIONS:  1. No need to monitor amylase and lipase serially, unless the patient      develops upper abdominal pain or overall change in status more      clinically since consistent with pancreatitis.  2. I would check amylase and lipase following delivery and/or when her      hyperemesis resolves.  If these labs remain elevated, I would then      recommend that the patient be referred back to me, so  that I could      arrange possible endoscopic ultrasound, which should probably be      the best way to assess her pancreas, with the least amount of risk.  3. The patient appears to have some degree of gestational heartburn,      so treatment in whatever fashion you feel is      appropriate  would probably be a good idea.  I note that she is on      Protonix here in the hospital, but it does not appear she was on      the PPI or an H2 blocker prior to admission.  4. Call any time if we can be of further assistance with this patient,      but for now we will plan to sign off.           ______________________________  Bernette Redbird, M.D.     RB/MEDQ  D:  02/28/2008  T:  02/28/2008  Job:  [id number]   cc:   Lesly Dukes, M.D.

## 2011-01-15 NOTE — Discharge Summary (Signed)
[name], [name]                 ACCOUNT NO.:  [id number]   MEDICAL RECORD NO.:  [id number]          PATIENT TYPE:  INP   LOCATION:  9304                          [facility]   PHYSICIAN:  Allie Bossier, MD        DATE OF BIRTH:  February 27, 1981   DATE OF ADMISSION:  02/26/2008  DATE OF DISCHARGE:  02/29/2008                               DISCHARGE SUMMARY   ADMISSION DIAGNOSES:  1. 12-2/7 week intrauterine pregnancy.  2. Hyperemesis.   DISCHARGE DIAGNOSES:  1. 12-5/7 week intrauterine pregnancy.  2. Hyperemesis gravidarum.  3. Gastrointestinal reflux.  4. Iron deficiency anemia.  5. Dehydration.   CONSULTS:  GI, Bernette Redbird, M.D.   HISTORY OF PRESENT ILLNESS:  [name] is a 30 year old G4, P1-0-2-1 who  is well known to the service with 2 previous admissions with hyperemesis  this pregnancy.  She has had an infected PICC line and she had a home  feeding tube.  She is followed weekly in high risk clinic.  Precipitating this admission, she had intractable vomiting despite  Phenergan suppositories.  She was not able to afford Zofran and Reglan;  however, of note during the pregnancy, she has been on Zofran,  Phenergan, Reglan, scopolamine patch, glycopyrrolate, and Prilosec.   BRIEF HOSPITAL COURSE:  On admission, the patient had greater than 80 of  ketones in her urine and was unable to retain liquids by mouth.  She was  also having some lower abdominal thigh pain and back pain for which she  did receive a dose of Stadol with some relief and this pain was thought  to be due to her repetitive vomiting.  She was treated with IV fluid  rehydration with D5 LR and was kept n.p.o. for the initial 24 hours.  She was given Zofran 8 mg q.6 h. p.r.n. and Phenergan 25 mg rectally for  her nausea.  She was afebrile, BP 98/56, and pulse 67.  On hospital day  #3, she was also noted to be constipated at which time she was stopped  from narcotics and given Colace and Dulcolax.  She retained  clear fluids  for 24 hours and on day of discharge was retaining solids on a bland  diet.  On February 28, 2008, she had GI consult with Dr. Matthias Hughs  who  recommended no serial monitoring of her elevated amylase and lipase, but  to check those labs either when she is fully resolved from hyperemesis  or when she is postpartum.  If this remains elevated, plan is to refer  back to him.  He also recommends continuing her antireflux medication.   DISCHARGE LABORATORY DATA:  Urine culture negative.  Potassium 3.2 up  from 2.9, BUN 3, creatinine 0.56, albumin 2.6, bili 1.1, amylase 232,  lipase 82, hemoglobin 8.9, with MCV 81.   Ultrasound of the complete abdomen revealed normal exam without evidence  of cholelithiasis.   Her OB ultrasound was unremarkable except for a small right CLC.   DISCHARGE INSTRUCTIONS:  She was given the antenatal discharge sheet and  to call for any  symptoms or problems.  She will be followed with daily  home visits by [facility] and orders given to them are for IV LR  1000 L daily, Phenergan 12.5-25 mg, and her IV fluids daily.  She is  advised to gradually advance the diet.  She will have a followup visit  in [facility] Clinic in 1 week or sooner if needed.   DISCHARGE MEDICATIONS:  She is given a prescription for Protonix 40 mg  tablets to take one p.o. daily, also a prescription for folic acid 1 mg  per day, and will hold off on her prenatal vitamins or iron  supplementation until she is around 20 weeks'.   DISCHARGE CONDITION:  Stable.      [name], C.N.M.      Allie Bossier, MD  Electronically Signed    DP/MEDQ  D:  02/29/2008  T:  03/01/2008  Job:  [id number]

## 2011-01-18 NOTE — Discharge Summary (Signed)
NAMEGILBERT, [name]                           ACCOUNT NO.:  [id number]   MEDICAL RECORD NO.:  [id number]                   PATIENT TYPE:  INP   LOCATION:  9318                                 [facility]ACILITY:  [facility]   PHYSICIAN:  Tanya S. Shawnie Pons, M.D.                DATE OF BIRTH:  1981-03-31   DATE OF ADMISSION:  01/27/2004  DATE OF DISCHARGE:                                 DISCHARGE SUMMARY   DISCHARGE DIAGNOSIS:  Hyperemesis gravidarum.   DISCHARGE MEDICATIONS:  Prenatal vitamins and vitamin B6 50 mg p.o. b.i.d.   She is to follow up with her physician for her initial OB visit.   HOSPITAL COURSE:  [name] is a 30 year old G2 P0-0-1-0 who presented at 7  weeks with hyperemesis.  She was unable to tolerate any p.o. at the time and  continued to vomit despite Phenergan and Zofran in the MAU.  She was  therefore admitted for IV hydration as well as vomiting control.  By the  morning of Jan 28, 2004 the patient was no longer vomiting, felt well, and  was hungry. She was started on a full liquid diet as well as vitamin B6 and  discharged once she was tolerating p.o. intake.     Maylon Peppers Waynette Buttery, M.D.                     Shelbie Proctor. Shawnie Pons, M.D.    SAG/MEDQ  D:  01/28/2004  T:  01/28/2004  Job:  [id number]

## 2011-01-18 NOTE — Op Note (Signed)
[name], [name]                           ACCOUNT NO.:  [id number]   MEDICAL RECORD NO.:  [id number]                   PATIENT TYPE:  INP   LOCATION:  9326                                 FACILITY:  [facility]   PHYSICIAN:  Juan H. Lily Peer, M.D.             DATE OF BIRTH:  04-Sep-1980   DATE OF PROCEDURE:  DATE OF DISCHARGE:                                 OPERATIVE REPORT   INDICATION FOR OPERATION:  [age]-year-old gravida 2, para 0, AB 1, with  severe oligohydramnios,  underwent elective induction with spontaneous  passage of the placenta, and fetus was not expelled.  Patient taken to the  operating room for D&E secondary to retained POC, status post elective  abortion.   PREOPERATIVE DIAGNOSIS:  Retained products of conceptions status post  elective abortion secondary to severe oligohydramnios at 13 and 14 weeks  estimated gestational age.   POSTOPERATIVE DIAGNOSIS:  Retained products of conceptions, status post  elective abortion secondary to severe oligohydramnios at 13 and 14 weeks  estimated gestational age.   ANESTHESIA:  Versed, Fentanyl, nitrous oxide, and paracervical block with 1%  Xylocaine with 1:100,000 epinephrine.   SURGEON:  Juan H. Lily Peer, M.D.   PROCEDURE PERFORMED:  Dilatation and evacuation.   DESCRIPTION OF OPERATION:  After the patient was adequately counseled, she  was taken to the operating room where she underwent Versed, Fentanyl, and  nitrous oxide.  The vagina and perineum were prepped and draped in the usual  sterile fashion.  The anterior cervical lip was grasped with a single-tooth  tenaculum.  The uterus sounded to approximately 10 cm.  A 10 mm suction  curet was introduced into the intrauterine cavity and was interchanged with  a Hunter's curet to remove the retained remaining products of conception.  An aliquot of the tissue was submitted for karyotype.  The patient tolerated  the procedure well.  The single-tooth tenaculum was removed.   She was given  20 units of Pitocin and a liter of LR and transferred to recovery with  stable vital signs.  The patient's blood type is O positive.  Of note for  the paracervical block, she received 1% Xylocaine with 1:100,000 epinephrine  which was infiltrated into the cervical stroma at the 2, 4, 8 and 10 o'clock  position for a total of 10 mL.  The patient transferred to recovery with  stable vital signs.                                               Juan H. Lily Peer, M.D.    JHF/MEDQ  D:  08/13/2003  T:  08/13/2003  Job:  [id number]

## 2011-01-18 NOTE — H&P (Signed)
[name], [name]                             ACCOUNT NO.:  [id number]   MEDICAL RECORD NO.:  [id number]                   PATIENT TYPE:   LOCATION:                                       FACILITY:   PHYSICIAN:  Timothy P. Fontaine, M.D.           DATE OF BIRTH:   DATE OF ADMISSION:  08/11/2003  DATE OF DISCHARGE:                                HISTORY & PHYSICAL   CHIEF COMPLAINT:  Spontaneous rupture of membranes 14 weeks.   HISTORY OF PRESENT ILLNESS:  A 30 year old G2, P0, AB1 female at [redacted] weeks  gestation who initially presented complaining of vaginal bleeding.  The  patient also notes some cramping the morning of evaluation.  She notes a  light menses type flow that started in the morning, was primarily with  wiping.  She has no precipitating events such as intercourse, slips, falls,  or other abnormalities.  She was evaluated in the office and was found not  to be bleeding.  Had an ultrasound which showed oligohydramnios with no  fluid around the fetus.  The crown rump length of the ultrasound was  consistent with 14 weeks.  On requestioning the patient did note that she  had what she thought was a urine gush x2 the evening before on December 8,  two separate episodes where she would notice that her underwear got wet, but  then no subsequent leak or vaginal moisture.  On reexamination the cervix  again was noted to be long and closed.  There was no pooling but there was  Nitrazine positive in the os and fern was positive.  The patient is admitted  at this time for observation and serial ultrasound studies.  Of significance  historically on new OB examination she did have a positive GC culture for  which she received Rocephin.  Her prenatal course has also been complicated  by a low grade SIL Pap smear.  She underwent CNB December 1 where there was  an acetowhite area on the ectocervix.  A biopsy confirmed mild dysplasia.   PAST SURGICAL HISTORY:  1. TAB in 2003.  2. Nose  surgery in 2001.   ALLERGIES:  No medications.   REVIEW OF SYSTEMS:  Noncontributory.   FAMILY HISTORY:  Noncontributory.   PHYSICAL EXAMINATION:  HEENT:  Normal.  LUNGS:  Clear.  CARDIAC:  Regular rate.  No murmurs, rubs, or gallops.  ABDOMEN:  Benign with abdomen soft, nontender.  PELVIC:  External BUS, vagina grossly normal.  Cervix is long, grossly  normal.  GC/Chlamydia screen done.  Positive Nitrazine.  Positive ferning.  Bimanual:  Cervix long, closed.  Uterus 14 weeks.  Positive fetal heart  tones.   ASSESSMENT:  A 30 year old G2, P0, AB1 female [redacted] weeks gestation with  apparent spontaneous rupture of membranes unprovoked.  She does have a  history of a CNB December 1 where ectocervical biopsy was taken but this was  clearly off and away from the os.  There was no ECC performed.  She also has  a positive GC history and I reviewed with her whether this had caused  endocervical inflammation which may have caused the weakening in the  membranes which has predisposed her to the rupture of membranes.  She has no  recent intercourse.  Historically, no slips, falls, or other precipitating  events.  Will plan on admission, cover with intravenous antibiotics, Unasyn  3 g bolus 1.5 q.6h.  GC/Chlamydia rescreen was done.  Strep was done.  Will  plan on baseline studies to include infectious TORCH titers, anticardiolipin  antibodies, lupus anticoagulant, Parvo virus B19, intravenous hydrate, and  reultrasound in 24 hours.  I reviewed with her the situation, that if  oligohydramnios continues that we may need to discuss termination.  The long-  term issues with early onset oligohydramnios were reviewed to include  pulmonary hypoplasia as well as structural deformities.  The patient  understands the situation and agrees with the plan.                                               Timothy P. Audie Box, M.D.    TPF/MEDQ  D:  08/11/2003  T:  08/11/2003  Job:  [id number]

## 2011-01-18 NOTE — Discharge Summary (Signed)
[name], [name]                           ACCOUNT NO.:  [id number]   MEDICAL RECORD NO.:  [id number]                   PATIENT TYPE:  OBV   LOCATION:  9306                                 [facility]ILITY:  [facility]   PHYSICIAN:  Timothy P. Fontaine, M.D.           DATE OF BIRTH:  09/17/1980   DATE OF ADMISSION:  07/25/2003  DATE OF DISCHARGE:  07/26/2003                                 DISCHARGE SUMMARY   DISCHARGE DIAGNOSES:  1. Mild hyperemesis antepartum.  2. Approximately [redacted] weeks gestation.   PROCEDURE:  1. IV fluids for hypertension.  2. Anti-emetics.   HISTORY OF PRESENT ILLNESS:  A 30 year old, gravida 2, AB1, para 0, who was  seen in the office for nausea and vomiting, and was found to have positive  ketones.  She was unable to tolerate any fluids at home.  She had had  similar problems two weeks earlier, and received IV hydration at that time  also.  She was not taking any anti-emetics at home.  Denies any diarrhea,  fever, or chills.   LABORATORY DATA:  She is O positive blood type.  She does have positive  fetal heart tones.   HOSPITAL COURSE AND TREATMENT:  The patient was 23 hour observation for IV  fluids for hydration and anti-emetics.  She has had a 4 pound weight loss.  She was given IV hydration, Zofran, and IV Phenergan.  She was discharged  home the following day - undelivered.   DISCHARGE INSTRUCTIONS:  She is to follow up in the office.     Davonna Belling. Young, N.P.                      Timothy P. Audie Box, M.D.    [facility]  D:  09/15/2003  T:  09/15/2003  Job:  [id number]

## 2011-01-18 NOTE — Discharge Summary (Signed)
[name], [name]                           ACCOUNT NO.:  [id number]   MEDICAL RECORD NO.:  [id number]                   PATIENT TYPE:  INP   LOCATION:  9326                                 [facility]   PHYSICIAN:  Juan H. Lily Peer, M.D.             DATE OF BIRTH:  1981-08-29   DATE OF ADMISSION:  08/11/2003  DATE OF DISCHARGE:  08/14/2003                                 DISCHARGE SUMMARY   DISCHARGE DIAGNOSIS:  Intrauterine pregnancy at 14 weeks with  oligohydramnios with premature rupture of membranes.   PROCEDURE:  Elective induction and spontaneous delivery with retained  products of conception with a D&E.   HISTORY OF PRESENT ILLNESS:  A 31 year old gravida 2, para 0, AB 1 at 14  weeks, was initially presenting complaining of vaginal bleeding.  She had  noticed some cramping and had noticed some late menstrual-type flow.  She  had had no precipitating events such as intercourse, falls, or other  problems.  Evaluated in the office and we found not to be bleeding but to  have a positive fern and positive Nitrazine.  She was admitted to the  hospital.  Her cervix was noted to be long and closed.   SIGNIFICANT HISTORY:  On her initial OB exam, she was noted to have a  positive GC culture and had received Rocephin at that time.  Her prenatal  course had also been complicated by a low-grade SIL and she had a colposcopy  and biopsy that showed an acetowhite area on the active cervix with biopsy  confirming a mild dysplasia.   LABORATORY DATA:  She is O positive blood type.  She had numerous  laboratories such as TORCH titers, HSV titers, __________ antibodies.   HOSPITAL COURSE AND TREATMENT:  The patient was admitted on August 11, 2003.  At that point, she was admitted for observation.  Ultrasound did note  continued oligohydramnios.  She elected to induce.  She did have partial  expulsion of some fetus and placenta.  She was taken to the OR to deliver  the fetus.  She  was given antibiotics, __________ .  Post D&E she did well.  She remained afebrile and she was discharged home on August 14, 2003 with  Motrin as needed for pain.  Continue her iron supplements twice daily.  Follow up in the office in 2 weeks.  Was also instructed to have boyfriend  treated.  He had never been treated for the positive gonorrhea.     Davonna Belling. Young, N.P.                      Lars Mage H. Lily Peer, M.D.    [facility]  D:  09/15/2003  T:  09/15/2003  Job:  [id number]

## 2011-01-18 NOTE — Discharge Summary (Signed)
[name], [name]                           ACCOUNT NO.:  [id number]   MEDICAL RECORD NO.:  [id number]                   PATIENT TYPE:  INP   LOCATION:  9303                                 [facility]ACILITY:  [facility]   PHYSICIAN:  Tanya S. Shawnie Pons, M.D.                DATE OF BIRTH:  10/27/1980   DATE OF ADMISSION:  03/18/2004  DATE OF DISCHARGE:  03/21/2004                                 DISCHARGE SUMMARY   ADDENDUM   DISCHARGE MEDICATIONS:  Rocephin 1 g p.o. daily x14 days per Dr. Ninetta Lights of  infectious disease, [facility].   HOSPITAL FOLLOW-UP:  After 2-week course of Rocephin the patient needs to  have repeat blood cultures.  The patient also needs to have repeat  urinalysis at clinic visit for yeast.     [name], M.D.               Shelbie Proctor. Shawnie Pons, M.D.    VRE/MEDQ  D:  03/22/2004  T:  03/22/2004  Job:  [id number]

## 2011-01-18 NOTE — Discharge Summary (Signed)
[name], [name]                           ACCOUNT NO.:  [id number]   MEDICAL RECORD NO.:  [id number]                   PATIENT TYPE:  INP   LOCATION:  9303                                 [facility]ILITY[facility]   PHYSICIAN:  Tanya S. Shawnie Pons, M.D.                DATE OF BIRTH:  Oct 10, 1980   DATE OF ADMISSION:  03/18/2004  DATE OF DISCHARGE:  03/21/2004                                 DISCHARGE SUMMARY   ADMISSION DIAGNOSES:  1. Hyperemesis.  2. Fever.   DISCHARGE DIAGNOSES:  81. A 30 year old gravida 2 para 0-0-1-0 at 14 weeks 4 days.  2. Superficial thrombus in right lower forearm and cephalic vein.   DISCHARGE MEDICATIONS:  1. Phenergan in intravenous fluids organized by home health.  2. Prenatal vitamins one tablet p.o. daily.   STUDIES:  1. Chest x-ray PA and lateral on March 18, 2004:  No pneumonia, question of     bronchitis.  2. Upper extremity venous evaluation:  Superficial thrombus noted in lower     forearm and cephalic vein, patent above and below the area.  All other     veins patent.  3. OB ultrasound:  Single fetus, variable presentation, 172 beats per     minute, placenta anterior, no placenta previa, amniotic fluid 5.1 cm.   ADMISSION HISTORY:  A 30 year old G__________ at 14 weeks 1 day presented to  the [facility] complaining of nausea, vomiting, abdominal pain, and fever for 1-2  days.  The patient was evaluated in the MAU on March 11, 2004 complaining of  leaking fluid.  The patient was admitted and treated for a PIC line  infection in July 2005.  She has a history of hyperemesis this pregnancy,  has peripheral IV line in right arm, and received IV fluids with Phenergan  per home health.   HOSPITAL COURSE:  The patient was admitted for monitoring.  The patient's  blood and urine were cultured.  The patient's blood grew Stenotrophomonas  which is thought to be a colonizer of peripheral lines.  The patient also  was sent for chest x-ray which showed no pneumonia,  questionable bronchitis.  At that point infectious disease was consulted which suggested for the team  to continue the current antibiotic treatment of Rocephin and to get a right  arm Doppler.  Doppler showed superficial vein thrombosis in the cephalic  vein.  All other veins patent.  The patient's urine culture grew out yeast  for which the patient was treated with Diflucan 150 mg p.o. before  discharge.  Urinalysis at admission showed trace nitrites and negative  leukocyte esterase.  White blood count 8000.  The patient did not have any  vomiting episodes during admission.  She was allowed a regular diet which  she tolerated appropriately.  Fetal heart rate was reassuring throughout  admission by Doppler.  The patient denied loss of fluid, bleeding, and  uterine  contractions.  The patient also remained afebrile throughout  hospital stay.  The patient will follow up in the high risk clinic at  prescheduled appointment in 3 weeks.   CONDITION ON DISCHARGE:  Stable, afebrile.   INSTRUCTIONS GIVEN TO PATIENT:  Plan of care was discussed with the patient.  The patient voiced understanding of plan of care.  Will discharge the  patient to home and will schedule intravenous fluid care with home health.  The patient will follow up in high risk clinic at prescheduled appointment  in 3 weeks.     [name], M.D.               Shelbie Proctor. Shawnie Pons, M.D.    VRE/MEDQ  D:  03/21/2004  T:  03/21/2004  Job:  [id number]   cc:   [name], M.D.  Fax: [phone]

## 2011-01-18 NOTE — Discharge Summary (Signed)
[name], [name]                           ACCOUNT NO.:  [id number]   MEDICAL RECORD NO.:  [id number]                   PATIENT TYPE:  INP   LOCATION:  9317                                 [facility]ACILITY:  [facility]   PHYSICIAN:  Conni Elliot, M.D.             DATE OF BIRTH:  [birth year]   DATE OF ADMISSION:  02/19/2004  DATE OF DISCHARGE:  02/23/2004                                 DISCHARGE SUMMARY   DISCHARGE DIAGNOSES:  1. Fever.  2. Peripherally-inserted central line positive for Stenotrophomonas.  3. Hyperemesis.  4. Gravida 3 para 0-0-2-0 at 10 weeks 4 days.  5. Dehydration.   DISCHARGE MEDICATIONS:  1. Phenergan 12.5 to 25 mg p.o. q.4-6h. p.r.n. nausea.  2. Prenatal vitamins one p.o. daily.   DISCHARGE INSTRUCTIONS:  Home health nursing will come to your house for  daily weights, temperature check, and hydration status x3 days and then this  will be spread out to once every week.  They have been instructed that if  ketones are elevated and the patient is showing signs of dehydration to  start IV fluids, lactated Ringers 250 mL/hour x2 L.   HOSPITAL LABORATORY DATA:  Urinalysis:  Specific gravity 1.025, greater than  80 ketones, trace hemoglobin, 0-2 red blood cells on admission.  CBC:  White  blood cells 13, hemoglobin 10.3, hematocrit 31.7, platelets 212.   CONSULTS:  Dr. Orvan Falconer with infectious disease.   HOSPITAL COURSE:  The patient is a 30 year old G3 P0-0-2-0 who was admitted  at 10 weeks 4 days with complaints of persistent nausea and vomiting and  back pain.  She has a history of hyperemesis gravidarum and has had a PIC  line for IV hydration to treat the hyperemesis at home.  Of note, in the MAU  the patient's temperature was 100.3.  Following her admission to the  hospital and treatment with Phenergan and IV fluids for dehydration it was  noted that the patient had  temperature spike up to 103 degrees Fahrenheit.  Peripheral blood culture was obtained showing  no bacteria.  The [facility] line was  pulled and cultured at both the catheter itself and the tip of the catheter.  The catheter itself was cultured positive for Stenotrophomonas.  The [facility]  line tip was negative for bacteria.  The patient was treated while awaiting  these culture results with Ancef for a total of three days.  Infectious  disease was consulted to make recommendations on treatment of this unusual  organism.  Dr. Orvan Falconer felt that this one positive blood culture could  represent an insignificant contaminant or catheter colonization but since it  was clear that she defervesced rapidly after the [facility] line was removed that  she does not have true bacteremia.  He also instructed that if the [facility] was  the source of the fever that the removal of this will likely be sufficient  treatment.  The patient  was then removed off of antibiotics and observed for  greater than 24 hours.  She had no further fever and was asymptomatic.  Her  hyperemesis resolved with hydration and Phenergan.  On the day of discharge  the patient had no complaints.  She was discharged home with home health  nursing to see her daily for 3 days, then once a week.  Home health  nursing will check her temperature, hydration status, and daily weights.  The patient will follow up i the high risk clinic in 1 week.  The patient  will measure her own temperatures at home twice a day and will return to the  MAU for temperature greater than 100.4.     Kerby Nora, MD                           Conni Elliot, M.D.    AB/MEDQ  D:  04/17/2004  T:  04/18/2004  Job:  [id number]

## 2011-01-18 NOTE — H&P (Signed)
[name], [name]                           ACCOUNT NO.:  [id number]   MEDICAL RECORD NO.:  [id number]                   PATIENT TYPE:  OBV   LOCATION:  9306                                 [facility]ILITY[facility]   PHYSICIAN:  Timothy P. Fontaine, M.D.           DATE OF BIRTH:  1981-02-28   DATE OF ADMISSION:  07/25/2003  DATE OF DISCHARGE:                                HISTORY & PHYSICAL   CHIEF COMPLAINT:  Nausea and vomiting.   HISTORY OF PRESENT ILLNESS:  A 30 year old G71, P0, AB1 female at  approximately [redacted] weeks gestation who enters with increasing nausea and  vomiting, unable to hold down fluids.  The patient had been evaluated two  weeks ago with similar complaints and received IV hydration and she now  returns unable to drink.  She is on no antiemetics.  Notes no viral  symptoms.  No diarrhea, fevers, or chills.  Evaluation in the office showed  large ketonuria and she is admitted at this time for IV hydration.  For  remainder of her history, see her [place].   PHYSICAL EXAMINATION:  VITAL SIGNS:  Temperature 100.2.  Vital signs stable.  Blood pressure 116/63, pulse 72, respirations 18.  HEENT:  Normal.  LUNGS:  Clear.  CARDIAC:  Regular rate.  No murmurs, rubs, or gallops.  ABDOMEN:  Soft, nontender without masses, guarding, rebound, or  organomegaly.  Active bowel sounds.  Positive Doppler fetal heart tones.  PELVIC:  Deferred.   ASSESSMENT/PLAN:  A 30 year old G2, P0, AB1 female AB1 female 11 weeks with nausea and  vomiting of pregnancy.  Will admit at 23-hour observation for extended  intravenous hydration and antiemetic therapy with Phenergan.  Will check  baseline CBC and chem-20 for amylase.  Feel low grade temperature most  likely secondary to dehydration, rule out virus.  Will monitor temperature.  If becomes afebrile, then will follow expectantly.                                               Timothy P. Audie Box, M.D.    TPF/MEDQ  D:  07/26/2003  T:  07/26/2003  Job:   [id number]

## 2011-01-18 NOTE — Discharge Summary (Signed)
[name], LUTHER                             ACCOUNT NO.:  [id number]   MEDICAL RECORD NO.:  [id number]                   PATIENT TYPE:  INP   LOCATION:  9125                                 FACILITY:  [facility]   PHYSICIAN:  Lesly Dukes, M.D.              DATE OF BIRTH:  Dec 24, 1980   DATE OF ADMISSION:  07/07/2003  DATE OF DISCHARGE:  07/11/2003                                 DISCHARGE SUMMARY   DISCHARGE DIAGNOSES:  1. Hyperemesis.  2. Pancreatitis.  3. Dehydration.  4. Hypokalemia.  5. Bacteruria.   DISCHARGE MEDICATIONS:  1. Keflex 500 mg q.i.d. x7 days.  2. Unisom 50 mg b.i.d.  3. Vitamin B6 25 mg t.i.d.  4. Prenatal vitamins daily.   DISPOSITION:  The patient discharged to home.   FOLLOWUP:  1. The patient has an appointment tomorrow, July 12, 2003 8 a.m. at     [facility] for initial appointment.  2. The patient has an appointment July 26, 2003 at 3 p.m. with Nadyne Coombes. Fontaine, M.D.  3. Things to be followed up on:  The patient has a urine culture and acute     hepatitis panel pending at discharge.   BRIEF ADMISSION HISTORY:  This is a 30 year old African-American female G2,  P0-0-1-0 who presented to [facility] ER at 8 and 3 weeks with nausea,  vomiting, and abdominal pain.  Urine pregnancy was positive.  The patient  was transferred to [facility], initially admitted to Dr. Katrinka Blazing, and  transferred to teaching service.  The patient had complained of nausea,  vomiting over 12 hours.  No fever.  No dysuria.  No vaginal bleeding.  The  patient had no relief with p.o. Phenergan at home.   HOSPITAL COURSE:  Problem 1 - DEHYDRATION:  The patient was dehydrated on  admission with greater than 80 ketones in the urine.  She was given 1 LR L  bolus and another bag of D5 LR with 40 mEq KCl initially.  The patient's  potassium was 2.9.  She continued to receive IV fluids throughout entire  admission.  The patient had good urine output before  discharge.   Problem 2 - HYPOKALEMIA:  Again, patient's initial potassium was 2.9.  She  received 40 mEq and one IV bag bolus.  She received two and a half IV runs  of 10 mEq at [facility] ER.  The patient received another four runs of  potassium at [facility] and potassium before discharge was 4.2.   Problem 3 - ABDOMINAL PAIN:  The patient complained of right upper quadrant  abdominal pain after eating which was also associated with her nausea and  vomiting.  The patient had a right upper quadrant ultrasound which was  negative for gallstones.  The patient had an amylase and lipase drawn.  Lipase was 365.  Amylase was 640.  The  patient did not require pain  medications for pain control.  She was initially on clear liquid diet, but  advanced to regular diet and was tolerating that well before discharge.  There was no apparent cause for the patient's elevated pancreatic enzymes.  Triglycerides were 86.  The patient's initial LFTs were AST 38 and ALT 57.  There is no history of drug or alcohol usage.  The patient's TSH was 0.753.  Cause was finally thought to be secondary to the hyperemesis.   Problem 4 - HYPEREMESIS:  The patient was given Unisom and vitamin B6 while  inpatient which controlled her nausea.  Again, she was able to tolerate a  regular diet before discharge.   Problem 5 - BACTERURIA:  The patient's initial UA and urine microscopic done  at the [facility] ER showed positive leukocyte esterase and many bacteria  with few squamous cells.  The patient did have low grade temperatures during  hospitalization up to 99.6.  The patient was initiated on Keflex 500 mg  q.i.d. before discharge and urine culture was sent which is still pending.     Anastasio Auerbach, MD                          Lesly Dukes, M.D.    AD/MEDQ  D:  07/11/2003  T:  07/12/2003  Job:  [id number]

## 2011-01-27 ENCOUNTER — Inpatient Hospital Stay (INDEPENDENT_AMBULATORY_CARE_PROVIDER_SITE_OTHER)
Admission: RE | Admit: 2011-01-27 | Discharge: 2011-01-27 | Disposition: A | Discharge status: Home / Self Care | Payer: Self-pay | Source: Ambulatory Visit | Attending: Emergency Medicine | Admitting: Emergency Medicine

## 2011-01-27 DIAGNOSIS — K089 Disorder of teeth and supporting structures, unspecified: Secondary | ICD-10-CM

## 2011-05-16 ENCOUNTER — Inpatient Hospital Stay (INDEPENDENT_AMBULATORY_CARE_PROVIDER_SITE_OTHER)
Admission: RE | Admit: 2011-05-16 | Discharge: 2011-05-16 | Disposition: A | Discharge status: Home / Self Care | Payer: Self-pay | Source: Ambulatory Visit | Attending: Family Medicine | Admitting: Family Medicine

## 2011-05-16 ENCOUNTER — Ambulatory Visit (INDEPENDENT_AMBULATORY_CARE_PROVIDER_SITE_OTHER): Payer: Self-pay

## 2011-05-16 DIAGNOSIS — J40 Bronchitis, not specified as acute or chronic: Secondary | ICD-10-CM

## 2011-05-22 ENCOUNTER — Other Ambulatory Visit (HOSPITAL_COMMUNITY): Payer: Self-pay | Admitting: Chiropractic Medicine

## 2011-05-22 DIAGNOSIS — R52 Pain, unspecified: Secondary | ICD-10-CM

## 2011-05-29 LAB — COMPREHENSIVE METABOLIC PANEL
ALT: 11
Albumin: 2.6 — ABNORMAL LOW
Alkaline Phosphatase: 31 — ABNORMAL LOW
BUN: 2 — ABNORMAL LOW
BUN: 8
Calcium: 9.5
Chloride: 107
Creatinine, Ser: 0.68
Glucose, Bld: 96
Glucose, Bld: 96
Potassium: 2.9 — ABNORMAL LOW
Total Bilirubin: 0.9
Total Protein: 6.1

## 2011-05-29 LAB — URINALYSIS, ROUTINE W REFLEX MICROSCOPIC
Glucose, UA: NEGATIVE
Nitrite: NEGATIVE
Protein, ur: 100 — AB
Protein, ur: 30 — AB
Specific Gravity, Urine: 1.039 — ABNORMAL HIGH
Specific Gravity, Urine: >1.03 — ABNORMAL HIGH
Urobilinogen, UA: 1

## 2011-05-29 LAB — CBC
HCT: 30.1 — ABNORMAL LOW
Hemoglobin: 10 — ABNORMAL LOW
MCV: 80.4
Platelets: 228
RDW: 14.6
WBC: 7.8
WBC: 9.9

## 2011-05-29 LAB — DIFFERENTIAL
Basophils Absolute: 0.1
Basophils Relative: 1
Eosinophils Absolute: 0.2
Eosinophils Relative: 2
Lymphs Abs: 1.7
Neutrophils Relative %: 74

## 2011-05-29 LAB — URINE MICROSCOPIC-ADD ON

## 2011-05-29 LAB — GC/CHLAMYDIA PROBE AMP, GENITAL: GC Probe Amp, Genital: NEGATIVE

## 2011-05-29 LAB — WET PREP, GENITAL
Trich, Wet Prep: NONE SEEN
Yeast Wet Prep HPF POC: NONE SEEN

## 2011-05-29 LAB — BASIC METABOLIC PANEL
BUN: 1 — ABNORMAL LOW
CO2: 24
Calcium: 9.1
Creatinine, Ser: 0.56
GFR calc Af Amer: >60

## 2011-05-29 LAB — HCG, QUANTITATIVE, PREGNANCY: hCG, Beta Chain, Quant, S: 52640 — ABNORMAL HIGH

## 2011-05-29 LAB — POCT I-STAT, CHEM 8
HCT: 40
Hemoglobin: 13.6
Potassium: 3.4 — ABNORMAL LOW
Sodium: 137
TCO2: 18

## 2011-05-29 LAB — H. PYLORI ANTIBODY, IGG: H Pylori IgG: 0.4

## 2011-05-29 LAB — TSH: TSH: 0.843

## 2011-05-29 LAB — AMYLASE: Amylase: 201 — ABNORMAL HIGH

## 2011-05-29 LAB — LIPASE, BLOOD: Lipase: 85 — ABNORMAL HIGH

## 2011-05-30 LAB — POCT URINALYSIS DIP (DEVICE)
Ketones, ur: 80 — AB
Ketones, ur: >=160 — AB
Operator id: 15968
Operator id: 134861
Protein, ur: 30 — AB
Protein, ur: 30 — AB
Specific Gravity, Urine: 1.025
Specific Gravity, Urine: 1.025
Urobilinogen, UA: 1
Urobilinogen, UA: 2 — ABNORMAL HIGH
pH: 6
pH: 6.5

## 2011-05-30 LAB — URINALYSIS, ROUTINE W REFLEX MICROSCOPIC
Ketones, ur: >80 — AB
Leukocytes, UA: NEGATIVE
Leukocytes, UA: NEGATIVE
Nitrite: NEGATIVE
Nitrite: NEGATIVE
Protein, ur: 100 — AB
Specific Gravity, Urine: >1.03 — ABNORMAL HIGH
Urobilinogen, UA: 0.2
Urobilinogen, UA: 0.2
pH: 6

## 2011-05-30 LAB — COMPREHENSIVE METABOLIC PANEL
ALT: 18
ALT: 20
ALT: 22
AST: 19
Albumin: 4
Alkaline Phosphatase: 29 — ABNORMAL LOW
BUN: 13
CO2: 18 — ABNORMAL LOW
CO2: 21
CO2: 23
Calcium: 9.1
Calcium: 9.5
Calcium: 9.6
Chloride: 107
Chloride: 109
Creatinine, Ser: 0.56
Creatinine, Ser: 0.69
Creatinine, Ser: 0.72
GFR calc Af Amer: >60
GFR calc Af Amer: >60
GFR calc non Af Amer: >60
GFR calc non Af Amer: >60
GFR calc non Af Amer: >60
Glucose, Bld: 107 — ABNORMAL HIGH
Glucose, Bld: 99
Potassium: 3.8
Sodium: 134 — ABNORMAL LOW
Sodium: 138
Total Bilirubin: 1.2
Total Bilirubin: 1.3 — ABNORMAL HIGH

## 2011-05-30 LAB — URINE MICROSCOPIC-ADD ON

## 2011-05-30 LAB — LIPASE, BLOOD: Lipase: 82 — ABNORMAL HIGH

## 2011-05-30 LAB — AMYLASE
Amylase: 290 — ABNORMAL HIGH
Amylase: 403 — ABNORMAL HIGH

## 2011-05-30 LAB — CBC
Hemoglobin: 8.9 — ABNORMAL LOW
MCHC: 33.3
MCV: 80.9
MCV: 81.2
Platelets: 253
RBC: 3.28 — ABNORMAL LOW
WBC: 9.8

## 2011-05-30 LAB — BLOOD GAS, ARTERIAL
Acid-base deficit: 5.2 — ABNORMAL HIGH
Bicarbonate: 19 — ABNORMAL LOW
O2 Saturation: 98.7
pO2, Arterial: 96.9

## 2011-05-30 LAB — URINE CULTURE: Colony Count: 15000

## 2011-05-31 ENCOUNTER — Ambulatory Visit (HOSPITAL_COMMUNITY)
Admission: RE | Admit: 2011-05-31 | Discharge: 2011-05-31 | Disposition: A | Discharge status: Home / Self Care | Payer: Self-pay | Source: Ambulatory Visit | Attending: Chiropractic Medicine | Admitting: Chiropractic Medicine

## 2011-05-31 DIAGNOSIS — R209 Unspecified disturbances of skin sensation: Secondary | ICD-10-CM | POA: Insufficient documentation

## 2011-05-31 DIAGNOSIS — M79609 Pain in unspecified limb: Secondary | ICD-10-CM | POA: Insufficient documentation

## 2011-05-31 DIAGNOSIS — N289 Disorder of kidney and ureter, unspecified: Secondary | ICD-10-CM | POA: Insufficient documentation

## 2011-05-31 DIAGNOSIS — R52 Pain, unspecified: Secondary | ICD-10-CM

## 2011-06-03 LAB — POCT URINALYSIS DIP (DEVICE)
Glucose, UA: NEGATIVE
Hgb urine dipstick: NEGATIVE
Nitrite: NEGATIVE
Protein, ur: 30 — AB
Urobilinogen, UA: 0.2

## 2011-06-03 LAB — URINALYSIS, ROUTINE W REFLEX MICROSCOPIC
Bilirubin Urine: NEGATIVE
Nitrite: NEGATIVE
Protein, ur: NEGATIVE
Specific Gravity, Urine: 1.025
Urobilinogen, UA: 0.2

## 2011-06-04 LAB — POCT URINALYSIS DIP (DEVICE)
Bilirubin Urine: NEGATIVE
Nitrite: NEGATIVE
Protein, ur: 100 — AB
Urobilinogen, UA: 0.2
pH: 7

## 2011-06-07 LAB — COMPREHENSIVE METABOLIC PANEL
ALT: 8 U/L (ref 0–35)
AST: 14 U/L (ref 0–37)
Albumin: 2.9 g/dL — ABNORMAL LOW (ref 3.5–5.2)
Alkaline Phosphatase: 115 U/L (ref 39–117)
Alkaline Phosphatase: 95 U/L (ref 39–117)
CO2: 22 mEq/L (ref 19–32)
Chloride: 105 mEq/L (ref 96–112)
Chloride: 107 mEq/L (ref 96–112)
GFR calc Af Amer: >60 mL/min (ref >60)
GFR calc non Af Amer: >60 mL/min (ref >60)
Glucose, Bld: 90 mg/dL (ref 70–99)
Potassium: 3 mEq/L — ABNORMAL LOW (ref 3.5–5.1)
Potassium: 3 mEq/L — ABNORMAL LOW (ref 3.5–5.1)
Sodium: 136 mEq/L (ref 135–145)
Total Bilirubin: 0.8 mg/dL (ref 0.3–1.2)
Total Protein: 5.8 g/dL — ABNORMAL LOW (ref 6.0–8.3)

## 2011-06-07 LAB — DIFFERENTIAL
Basophils Relative: 1 % (ref 0–1)
Eosinophils Absolute: 0.2 10*3/uL (ref 0.0–0.7)
Neutrophils Relative %: 59 % (ref 43–77)

## 2011-06-07 LAB — URINALYSIS, ROUTINE W REFLEX MICROSCOPIC
Glucose, UA: NEGATIVE mg/dL
Hgb urine dipstick: NEGATIVE
Protein, ur: 30 mg/dL — AB
Specific Gravity, Urine: 1.02 (ref 1.005–1.030)
Urobilinogen, UA: 2 mg/dL — ABNORMAL HIGH (ref 0.0–1.0)

## 2011-06-07 LAB — POCT URINALYSIS DIP (DEVICE)
Nitrite: NEGATIVE
Protein, ur: 100 mg/dL — AB
Urobilinogen, UA: 1 mg/dL (ref 0.0–1.0)
pH: 7 (ref 5.0–8.0)

## 2011-06-07 LAB — CBC
Hemoglobin: 8 g/dL — ABNORMAL LOW (ref 12.0–15.0)
Platelets: 173 10*3/uL (ref 150–400)
RBC: 2.95 MIL/uL — ABNORMAL LOW (ref 3.87–5.11)
RDW: 14.9 % (ref 11.5–15.5)
WBC: 8.1 10*3/uL (ref 4.0–10.5)

## 2011-06-07 LAB — URINE MICROSCOPIC-ADD ON

## 2011-06-07 LAB — H. PYLORI ANTIBODY, IGG: H Pylori IgG: <0.4 {ISR}

## 2011-06-07 LAB — LIPASE, BLOOD: Lipase: 18 U/L (ref 11–59)

## 2012-08-18 ENCOUNTER — Encounter (HOSPITAL_COMMUNITY): Payer: Self-pay | Admitting: *Deleted

## 2012-08-18 ENCOUNTER — Emergency Department (INDEPENDENT_AMBULATORY_CARE_PROVIDER_SITE_OTHER)
Admission: EM | Admit: 2012-08-18 | Discharge: 2012-08-18 | Disposition: A | Discharge status: Home / Self Care | Payer: Self-pay | Source: Home / Self Care | Attending: Family Medicine | Admitting: Family Medicine

## 2012-08-18 DIAGNOSIS — J020 Streptococcal pharyngitis: Secondary | ICD-10-CM

## 2012-08-18 HISTORY — DX: Unspecified asthma, uncomplicated: J45.909

## 2012-08-18 HISTORY — DX: Essential (primary) hypertension: I10

## 2012-08-18 LAB — POCT URINALYSIS DIP (DEVICE)
Bilirubin Urine: NEGATIVE
Nitrite: POSITIVE — AB
Urobilinogen, UA: 1 mg/dL (ref 0.0–1.0)
pH: 7 (ref 5.0–8.0)

## 2012-08-18 MED ORDER — ACETAMINOPHEN 325 MG PO TABS
650.0000 mg | ORAL_TABLET | Freq: Once | ORAL | Status: AC
  Administered 2012-08-18: 650 mg via ORAL

## 2012-08-18 MED ORDER — ACETAMINOPHEN 325 MG PO TABS
ORAL_TABLET | ORAL | Status: AC
  Filled 2012-08-18: qty 2

## 2012-08-18 MED ORDER — CEFDINIR 300 MG PO CAPS: 300.0000 mg | ORAL_CAPSULE | Freq: Two times a day (BID) | ORAL | Status: DC

## 2012-08-18 NOTE — ED Provider Notes (Signed)
History     CSN: [id number]  Arrival date & time 08/18/12  1554   First MD Initiated Contact with Patient 08/18/12 1606      Chief Complaint  Patient presents with  . Fever    (Consider location/radiation/quality/duration/timing/severity/associated sxs/prior treatment) Patient is a 31 y.o. female presenting with fever. The history is provided by the patient.  Fever Primary symptoms of the febrile illness include fever. Primary symptoms do not include cough. The current episode started today. This is a new problem. The problem has been gradually worsening.    Past Medical History  Diagnosis Date  . Hypertension   . Asthma     History reviewed. No pertinent past surgical history.  No family history on file.  History  Substance Use Topics  . Smoking status: Not on file  . Smokeless tobacco: Not on file  . Alcohol Use:     OB History    Grav Para Term Preterm Abortions TAB SAB Ect Mult Living                  Review of Systems  Constitutional: Positive for fever.  HENT: Positive for ear pain, sore throat and trouble swallowing.   Respiratory: Negative for cough and choking.   Gastrointestinal: Negative.     Allergies  Review of patient's allergies indicates not on file.  Home Medications   Current Outpatient Rx  Name  Route  Sig  Dispense  Refill  . CEFDINIR 300 MG PO CAPS   Oral   Take 1 capsule (300 mg total) by mouth 2 (two) times daily.   20 capsule   0     BP 148/95  Pulse 63  Temp 101.7 F (38.7 C) (Oral)  Resp 18  SpO2 100%  LMP 08/16/2012  Physical Exam  Nursing note and vitals reviewed. Constitutional: She is oriented to person, place, and time. She appears well-developed and well-nourished. No distress.  HENT:  Head: Normocephalic.  Right Ear: External ear normal.  Left Ear: External ear normal.  Mouth/Throat: Uvula is midline and mucous membranes are normal. No uvula swelling. Oropharyngeal exudate and posterior oropharyngeal  erythema present.  Eyes: Pupils are equal, round, and reactive to light.  Neck: Normal range of motion. Neck supple.  Cardiovascular: Normal rate.   Pulmonary/Chest: Breath sounds normal.  Lymphadenopathy:    She has cervical adenopathy.  Neurological: She is alert and oriented to person, place, and time.  Skin: Skin is warm and dry.    ED Course  Procedures (including critical care time)  Labs Reviewed  POCT RAPID STREP A ([facility] URG CARE ONLY) - Abnormal; Notable for the following:    Streptococcus, Group A Screen (Direct) POSITIVE (*)     All other components within normal limits  POCT URINALYSIS DIP (DEVICE) - Abnormal; Notable for the following:    Hgb urine dipstick TRACE (*)     Nitrite POSITIVE (*)     Leukocytes, UA TRACE (*)  Biochemical Testing Only. Please order routine urinalysis from main lab if confirmatory testing is needed.   All other components within normal limits  POCT PREGNANCY, URINE   No results found.   1. Strep sore throat       MDM          Linna Hoff, MD 08/18/12 [phone]

## 2012-08-18 NOTE — ED Notes (Signed)
Pt  Reports  Symptoms  Of  Fever  Body  Aches    sorethroat  As  Well  As         Frequency  And  Burning  On  Urination   Symptoms  X  Several  Days    Pt   Has  Has  Symptoms  For  Several  Days

## 2012-08-21 ENCOUNTER — Encounter (HOSPITAL_COMMUNITY): Payer: Self-pay | Admitting: Adult Health

## 2012-08-21 ENCOUNTER — Emergency Department (INDEPENDENT_AMBULATORY_CARE_PROVIDER_SITE_OTHER)
Admission: EM | Admit: 2012-08-21 | Discharge: 2012-08-21 | Disposition: A | Discharge status: Nursing Home (Includes ICF) | Payer: Self-pay | Source: Home / Self Care

## 2012-08-21 ENCOUNTER — Encounter (HOSPITAL_COMMUNITY): Payer: Self-pay | Admitting: Emergency Medicine

## 2012-08-21 ENCOUNTER — Emergency Department (HOSPITAL_COMMUNITY)
Admission: EM | Admit: 2012-08-21 | Discharge: 2012-08-22 | Disposition: A | Discharge status: Home / Self Care | Payer: Self-pay | Attending: Emergency Medicine | Admitting: Emergency Medicine

## 2012-08-21 ENCOUNTER — Emergency Department (HOSPITAL_COMMUNITY): Payer: Self-pay

## 2012-08-21 DIAGNOSIS — R300 Dysuria: Secondary | ICD-10-CM | POA: Insufficient documentation

## 2012-08-21 DIAGNOSIS — R1310 Dysphagia, unspecified: Secondary | ICD-10-CM | POA: Insufficient documentation

## 2012-08-21 DIAGNOSIS — J45909 Unspecified asthma, uncomplicated: Secondary | ICD-10-CM | POA: Insufficient documentation

## 2012-08-21 DIAGNOSIS — M549 Dorsalgia, unspecified: Secondary | ICD-10-CM | POA: Insufficient documentation

## 2012-08-21 DIAGNOSIS — Z87440 Personal history of urinary (tract) infections: Secondary | ICD-10-CM | POA: Insufficient documentation

## 2012-08-21 DIAGNOSIS — R112 Nausea with vomiting, unspecified: Secondary | ICD-10-CM | POA: Insufficient documentation

## 2012-08-21 DIAGNOSIS — N12 Tubulo-interstitial nephritis, not specified as acute or chronic: Secondary | ICD-10-CM | POA: Insufficient documentation

## 2012-08-21 DIAGNOSIS — I10 Essential (primary) hypertension: Secondary | ICD-10-CM | POA: Insufficient documentation

## 2012-08-21 DIAGNOSIS — J029 Acute pharyngitis, unspecified: Secondary | ICD-10-CM | POA: Insufficient documentation

## 2012-08-21 DIAGNOSIS — N390 Urinary tract infection, site not specified: Secondary | ICD-10-CM

## 2012-08-21 DIAGNOSIS — E860 Dehydration: Secondary | ICD-10-CM

## 2012-08-21 DIAGNOSIS — R109 Unspecified abdominal pain: Secondary | ICD-10-CM | POA: Insufficient documentation

## 2012-08-21 DIAGNOSIS — J020 Streptococcal pharyngitis: Secondary | ICD-10-CM

## 2012-08-21 DIAGNOSIS — Z79899 Other long term (current) drug therapy: Secondary | ICD-10-CM | POA: Insufficient documentation

## 2012-08-21 LAB — URINE MICROSCOPIC-ADD ON

## 2012-08-21 LAB — URINALYSIS, ROUTINE W REFLEX MICROSCOPIC
Ketones, ur: >80 mg/dL — AB
Protein, ur: 100 mg/dL — AB
Urobilinogen, UA: 2 mg/dL — ABNORMAL HIGH (ref 0.0–1.0)

## 2012-08-21 LAB — CBC WITH DIFFERENTIAL/PLATELET
Eosinophils Relative: 1 % (ref 0–5)
HCT: 36.4 % (ref 36.0–46.0)
Lymphs Abs: 2.6 10*3/uL (ref 0.7–4.0)
MCH: 25.3 pg — ABNORMAL LOW (ref 26.0–34.0)
MCV: 77.9 fL — ABNORMAL LOW (ref 78.0–100.0)
Monocytes Absolute: 1.7 10*3/uL — ABNORMAL HIGH (ref 0.1–1.0)
Neutro Abs: 12.5 10*3/uL — ABNORMAL HIGH (ref 1.7–7.7)
Platelets: 278 10*3/uL (ref 150–400)
RBC: 4.67 MIL/uL (ref 3.87–5.11)

## 2012-08-21 LAB — COMPREHENSIVE METABOLIC PANEL
Alkaline Phosphatase: 97 U/L (ref 39–117)
BUN: 22 mg/dL (ref 6–23)
CO2: 25 mEq/L (ref 19–32)
Chloride: 104 mEq/L (ref 96–112)
Creatinine, Ser: 0.87 mg/dL (ref 0.50–1.10)
GFR calc Af Amer: >90 mL/min (ref >90)
GFR calc non Af Amer: 89 mL/min — ABNORMAL LOW (ref >90)
Glucose, Bld: 86 mg/dL (ref 70–99)
Total Bilirubin: 0.5 mg/dL (ref 0.3–1.2)

## 2012-08-21 LAB — MONONUCLEOSIS SCREEN: Mono Screen: NEGATIVE

## 2012-08-21 MED ORDER — PROMETHAZINE HCL 25 MG/ML IJ SOLN
25.0000 mg | Freq: Once | INTRAMUSCULAR | Status: AC
  Administered 2012-08-22: 25 mg via INTRAVENOUS
  Filled 2012-08-21 (×2): qty 1

## 2012-08-21 MED ORDER — KETOROLAC TROMETHAMINE 30 MG/ML IJ SOLN
30.0000 mg | Freq: Once | INTRAMUSCULAR | Status: AC
  Administered 2012-08-21: 30 mg via INTRAVENOUS
  Filled 2012-08-21: qty 1

## 2012-08-21 MED ORDER — AMOXICILLIN 500 MG PO CAPS: 1000.0000 mg | ORAL_CAPSULE | Freq: Every day | ORAL | Status: DC

## 2012-08-21 MED ORDER — MAGIC MOUTHWASH
10.0000 mL | Freq: Once | ORAL | Status: AC
  Administered 2012-08-21: 10 mL via ORAL
  Filled 2012-08-21 (×2): qty 10

## 2012-08-21 MED ORDER — ONDANSETRON HCL 4 MG/2ML IJ SOLN
4.0000 mg | Freq: Once | INTRAMUSCULAR | Status: AC
  Administered 2012-08-21: 4 mg via INTRAVENOUS
  Filled 2012-08-21: qty 2

## 2012-08-21 MED ORDER — SODIUM CHLORIDE 0.9 % IV BOLUS (SEPSIS)
1000.0000 mL | Freq: Once | INTRAVENOUS | Status: AC
  Administered 2012-08-21: 1000 mL via INTRAVENOUS

## 2012-08-21 MED ORDER — DEXAMETHASONE SODIUM PHOSPHATE 10 MG/ML IJ SOLN
10.0000 mg | Freq: Once | INTRAMUSCULAR | Status: AC
  Administered 2012-08-21: 10 mg via INTRAVENOUS
  Filled 2012-08-21: qty 1

## 2012-08-21 MED ORDER — PROMETHAZINE HCL 25 MG PO TABS: 25.0000 mg | ORAL_TABLET | Freq: Four times a day (QID) | ORAL | Status: DC | PRN

## 2012-08-21 MED ORDER — PROMETHAZINE HCL 25 MG/ML IJ SOLN
25.0000 mg | Freq: Once | INTRAMUSCULAR | Status: AC
  Administered 2012-08-21: 25 mg via INTRAVENOUS
  Filled 2012-08-21: qty 1

## 2012-08-21 MED ORDER — CEPHALEXIN 500 MG PO CAPS: 500.0000 mg | ORAL_CAPSULE | Freq: Two times a day (BID) | ORAL | Status: DC

## 2012-08-21 MED ORDER — HYDROCODONE-ACETAMINOPHEN 7.5-500 MG/15ML PO SOLN: 15.0000 mL | Freq: Four times a day (QID) | ORAL | Status: DC | PRN

## 2012-08-21 MED ORDER — DEXTROSE 5 % IV SOLN
1.0000 g | Freq: Once | INTRAVENOUS | Status: AC
  Administered 2012-08-21: 1 g via INTRAVENOUS
  Filled 2012-08-21: qty 10

## 2012-08-21 MED ORDER — PREDNISONE 20 MG PO TABS: 40.0000 mg | ORAL_TABLET | Freq: Every day | ORAL | Status: DC

## 2012-08-21 NOTE — ED Provider Notes (Signed)
History     CSN: [id number]  Arrival date & time 08/21/12  1409   None     Chief Complaint  Patient presents with  . Sore Throat    (Consider location/radiation/quality/duration/timing/severity/associated sxs/prior treatment) HPI Comments: This 31 year old female was in the urgent care 4 days ago for complaints of sore throat and urinary tract symptoms. She was treated with Ceftinir  but was unable to fill her prescription. She has continued to deteriorate maintaining a fever, persistent vomiting decrease in oral intake inability to hold down fluids, back pain, malaise and fatigue. She states the pain in her pelvis is now radiating into her legs.    Past Medical History  Diagnosis Date  . Hypertension   . Asthma     History reviewed. No pertinent past surgical history.  No family history on file.  History  Substance Use Topics  . Smoking status: Never Smoker   . Smokeless tobacco: Not on file  . Alcohol Use: No    OB History    Grav Para Term Preterm Abortions TAB SAB Ect Mult Living                  Review of Systems  Constitutional: Positive for fever, chills, activity change and fatigue.  HENT: Positive for ear pain, sore throat and trouble swallowing.   Respiratory: Negative for shortness of breath and wheezing.   Gastrointestinal: Positive for nausea and vomiting.  Genitourinary: Positive for dysuria, urgency, frequency and pelvic pain.  Skin: Negative.     Allergies  Review of patient's allergies indicates no known allergies.  Home Medications   Current Outpatient Rx  Name  Route  Sig  Dispense  Refill  . CEFDINIR 300 MG PO CAPS   Oral   Take 1 capsule (300 mg total) by mouth 2 (two) times daily.   20 capsule   0     BP 146/94  Pulse 116  Temp 99.5 F (37.5 C) (Oral)  Resp 20  SpO2 98%  LMP 08/16/2012  Physical Exam  Nursing note and vitals reviewed. Constitutional: She is oriented to person, place, and time.       Appears moderately  ill  HENT:       Bilateral TMs are normal Oropharynx red with exudates  Neck: Normal range of motion. Neck supple.  Cardiovascular:       Tachycardia with a rate of 116 And 123 standing.  Pulmonary/Chest: Effort normal and breath sounds normal. No respiratory distress.  Abdominal: Soft. There is no tenderness.  Musculoskeletal: She exhibits tenderness. She exhibits no edema.  Neurological: She is alert and oriented to person, place, and time.  Skin: Skin is warm and dry.  Psychiatric: She has a normal mood and affect.    ED Course  Procedures (including critical care time)  Labs Reviewed - No data to display No results found.   1. Strep pharyngitis   2. UTI (lower urinary tract infection)   3. Pyelonephritis   4. Dehydration       MDM  She is unable to afford her medications has not been taking. Therefore, she has been getting worse with her strep throat and urinary tract infection diagnoses. She is now having increased pain in the throat and pelvis and ears. She is vomiting unable to hold liquids down and doubtfully she would be able to hold any medications down for at least for the next one to 2 days. I suspect she has developed a pyelonephritis and dehydration. We  will send her to the emergency department for IV fluids and probably IV antibiotics.          Hayden Rasmussen, NP 08/21/12 [phone]

## 2012-08-21 NOTE — ED Notes (Addendum)
Presents from [facility] due to dehydration, vomiting, strep pharyngitis, UTI. +orthostatic changes. Seen at [facility] on 12/17 DX with strep, unable to get RX filled went to [facility] today due to fever, inability to hold fluids down, back pain and sore throat. She is alert and oriented.  Labs from [facility] include +strep, Urinalysis +nitrates.

## 2012-08-21 NOTE — ED Provider Notes (Signed)
Medical screening examination/treatment/procedure(s) were performed by resident physician or non-physician practitioner and as supervising physician I was immediately available for consultation/collaboration.   Zavien Clubb DOUGLAS MD.    Latravia Southgate D Ivey Nembhard, MD 08/21/12 1957 

## 2012-08-21 NOTE — ED Provider Notes (Signed)
9:11 PM Assumed care of patient in the CDU from Dr. Rhunette Croft.  Patient presented today with a chief complaint of fever, sore throat, urinary symptoms, nausea, and vomiting.  Patient was found to have a UTI.  Patient given IVF and IV Rocephin.  Patient was unable to tolerate PO liquids and therefore, Hospitalist were called to admit the patient due to the fact that she could not tolerate PO liquids.  Hospitalist came down to see the patient and discovered that the patient is a Family Medicine.  Reassessed patient.  She reports that her symptoms have improved at this time.  Will repeat PO trial and reassess.  Patient alert and orientated x 3, Heart RRR, Lungs CTAB, Oropharynx with mild erythema and tonsils enlarged bilaterally, Uvula midline, Patient handling secretions well.    10:15 PM  Patient reports that she is unable to swallow due to pain in her throat.  Will order magic mouthwash and reassess.  Patient has not had any active vomiting while in the ED.  11:11 PM Patient reports that she continues to feel nauseous, but no vomiting.  She is able to tolerate PO liquids.  Will order Phenergan and reassess.  11:45 PM Patient reports that her symptoms have improved and would like to be discharged home.  Strict return precautions given.    Yolanda Lux Gray, PA-C 08/22/12 1614

## 2012-08-21 NOTE — ED Notes (Signed)
Patient transported to X-ray 

## 2012-08-21 NOTE — ED Notes (Addendum)
Pt c/o nausea unable to pass PO challenge. sts throat hurts too much to swallow. Heather PA notified.

## 2012-08-21 NOTE — ED Notes (Signed)
Pt c/o sore throat... Was seen on 08/18/12 here at the [facility] for pos Strep... Pt unable to obtain medication due to financial status... Sx include: vomiting, nauseas, diarrhea, bilateral ear pain... Has been gargling salt water... Denies: diarrhea... She is alert w/some discomfort due to sore throat.

## 2012-08-22 NOTE — ED Provider Notes (Signed)
Medical screening examination/treatment/procedure(s) were performed by non-physician practitioner and as supervising physician I was immediately available for consultation/collaboration.   Glynn Octave, MD 08/22/12 1739

## 2012-08-23 LAB — URINE CULTURE

## 2012-09-21 NOTE — ED Provider Notes (Signed)
History     CSN: [id number]  Arrival date & time 08/21/12  1628   First MD Initiated Contact with Patient 08/21/12 1653      Chief Complaint  Patient presents with  . Urinary Tract Infection  . Dehydration    (Consider location/radiation/quality/duration/timing/severity/associated sxs/prior treatment) HPI Comments: PT comes in with cc of dysuria and sore throat. Pt has been having some suprapubic pain and mild discomfort with urination for the past few days. She has hx of UTI. Pt also has some back pain and nausea associated with the dysuria/ No fevers, chills. Pt also has been having sore throat. She has some dysphagia. Decreased po intake due to the sore throat.   Patient is a 32 y.o. female presenting with urinary tract infection. The history is provided by the patient and medical records.  Urinary Tract Infection Associated symptoms include abdominal pain. Pertinent negatives include no chest pain, no headaches and no shortness of breath.    Past Medical History  Diagnosis Date  . Hypertension   . Asthma     History reviewed. No pertinent past surgical history.  History reviewed. No pertinent family history.  History  Substance Use Topics  . Smoking status: Never Smoker   . Smokeless tobacco: Not on file  . Alcohol Use: No    OB History    Grav Para Term Preterm Abortions TAB SAB Ect Mult Living                  Review of Systems  Constitutional: Positive for activity change.  HENT: Positive for sore throat and trouble swallowing. Negative for drooling and neck pain.   Respiratory: Negative for shortness of breath.   Cardiovascular: Negative for chest pain.  Gastrointestinal: Positive for abdominal pain. Negative for nausea and vomiting.  Genitourinary: Positive for dysuria.  Musculoskeletal: Positive for back pain.  Skin: Negative for rash.  Neurological: Negative for headaches.  Hematological: Does not bruise/bleed easily.  Psychiatric/Behavioral:  Negative for behavioral problems.    Allergies  Review of patient's allergies indicates no known allergies.  Home Medications   Current Outpatient Rx  Name  Route  Sig  Dispense  Refill  . AMOXICILLIN 500 MG PO CAPS   Oral   Take 2 capsules (1,000 mg total) by mouth daily.   20 capsule   0   . CEPHALEXIN 500 MG PO CAPS   Oral   Take 1 capsule (500 mg total) by mouth 2 (two) times daily.   14 capsule   0   . HYDROCODONE-ACETAMINOPHEN 7.5-500 MG/15ML PO SOLN   Oral   Take 15 mLs by mouth every 6 (six) hours as needed for pain.   120 mL   0   . PREDNISONE 20 MG PO TABS   Oral   Take 2 tablets (40 mg total) by mouth daily.   8 tablet   0   . PROMETHAZINE HCL 25 MG PO TABS   Oral   Take 1 tablet (25 mg total) by mouth every 6 (six) hours as needed for nausea.   20 tablet   0     BP 118/64  Pulse 98  Temp 99.8 F (37.7 C) (Oral)  Resp 16  SpO2 97%  LMP 08/16/2012  Physical Exam  Nursing note and vitals reviewed. Constitutional: She is oriented to person, place, and time. She appears well-developed and well-nourished.  HENT:  Head: Normocephalic and atraumatic.  Eyes: EOM are normal. Pupils are equal, round, and reactive to light.  Neck: Neck supple.  Cardiovascular: Normal rate, regular rhythm and normal heart sounds.   No murmur heard. Pulmonary/Chest: Effort normal. No respiratory distress.  Abdominal: Soft. She exhibits no distension. There is tenderness. There is no rebound and no guarding.  Lymphadenopathy:    She has cervical adenopathy.  Neurological: She is alert and oriented to person, place, and time.  Skin: Skin is warm and dry.    ED Course  Procedures (including critical care time)  Labs Reviewed  CBC WITH DIFFERENTIAL - Abnormal; Notable for the following:    WBC 17.0 (*)     Hemoglobin 11.8 (*)     MCV 77.9 (*)     MCH 25.3 (*)     Neutro Abs 12.5 (*)     Monocytes Absolute 1.7 (*)     All other components within normal limits    COMPREHENSIVE METABOLIC PANEL - Abnormal; Notable for the following:    Total Protein 8.9 (*)     GFR calc non Af Amer 89 (*)     All other components within normal limits  URINALYSIS, ROUTINE W REFLEX MICROSCOPIC - Abnormal; Notable for the following:    Color, Urine ORANGE (*)  BIOCHEMICALS MAY BE AFFECTED BY COLOR   APPearance CLOUDY (*)     Specific Gravity, Urine 1.042 (*)     Bilirubin Urine MODERATE (*)     Ketones, ur >80 (*)     Protein, ur 100 (*)     Urobilinogen, UA 2.0 (*)     Nitrite POSITIVE (*)     Leukocytes, UA SMALL (*)     All other components within normal limits  URINE MICROSCOPIC-ADD ON - Abnormal; Notable for the following:    Squamous Epithelial / LPF FEW (*)     Bacteria, UA MANY (*)     Casts HYALINE CASTS (*)     All other components within normal limits  URINE CULTURE  MONONUCLEOSIS SCREEN  LAB REPORT - SCANNED   No results found.   1. Pyelonephritis   2. Pharyngitis       MDM  Pt comes in with cc of UTI like sx and sore throat, Will get Lateral soft tissue neck to ensure there is no deep infection. UA checked - and is indicative of a UTI. With nausea, back pain - this is likely a pyelo,. Will treat with ceftriaxone and give nausea meds.  Derwood Kaplan, MD 09/21/12 1200

## 2012-10-30 ENCOUNTER — Encounter (HOSPITAL_COMMUNITY): Payer: Self-pay | Admitting: Emergency Medicine

## 2012-10-30 ENCOUNTER — Emergency Department (HOSPITAL_COMMUNITY)
Admission: EM | Admit: 2012-10-30 | Discharge: 2012-10-30 | Disposition: A | Discharge status: Home / Self Care | Payer: Self-pay | Attending: Emergency Medicine | Admitting: Emergency Medicine

## 2012-10-30 DIAGNOSIS — J3489 Other specified disorders of nose and nasal sinuses: Secondary | ICD-10-CM | POA: Insufficient documentation

## 2012-10-30 DIAGNOSIS — I10 Essential (primary) hypertension: Secondary | ICD-10-CM | POA: Insufficient documentation

## 2012-10-30 DIAGNOSIS — R52 Pain, unspecified: Secondary | ICD-10-CM | POA: Insufficient documentation

## 2012-10-30 DIAGNOSIS — R059 Cough, unspecified: Secondary | ICD-10-CM | POA: Insufficient documentation

## 2012-10-30 DIAGNOSIS — J45909 Unspecified asthma, uncomplicated: Secondary | ICD-10-CM | POA: Insufficient documentation

## 2012-10-30 DIAGNOSIS — IMO0001 Reserved for inherently not codable concepts without codable children: Secondary | ICD-10-CM | POA: Insufficient documentation

## 2012-10-30 DIAGNOSIS — Z79899 Other long term (current) drug therapy: Secondary | ICD-10-CM | POA: Insufficient documentation

## 2012-10-30 MED ORDER — OSELTAMIVIR PHOSPHATE 75 MG PO CAPS: 75.0000 mg | ORAL_CAPSULE | Freq: Two times a day (BID) | ORAL | Status: DC

## 2012-10-30 MED ORDER — SODIUM CHLORIDE 0.9 % IV BOLUS (SEPSIS): 1000.0000 mL | INTRAVENOUS | Status: DC

## 2012-10-30 MED ORDER — ACETAMINOPHEN 325 MG PO TABS
650.0000 mg | ORAL_TABLET | Freq: Once | ORAL | Status: AC
  Administered 2012-10-30: 650 mg via ORAL
  Filled 2012-10-30: qty 2

## 2012-10-30 NOTE — ED Provider Notes (Signed)
History    This chart was scribed for non-physician practitioner working with Yolanda Shi, MD by Toya Smothers, ED Scribe. This patient was seen in room TR10C/TR10C and the patient's care was started at 15:33 PM.   CSN: [id number]  Arrival date & time 10/30/12  1456   First MD Initiated Contact with Patient 10/30/12 1527      Chief Complaint  Patient presents with  . Influenza    Patient is a 32 y.o. female presenting with flu symptoms. The history is provided by the patient. No language interpreter was used.  Influenza Presenting symptoms: cough, fever, myalgias and rhinorrhea   Associated symptoms: chills     DONALD JACQUE is a 32 y.o. female with h/o HTN without complications, who presents to the Emergency Department complaining of 4 days of sudden onset, progressive, moderate fever with associate dry non-productive cough, generalized body aches, and mild rhinorrhea. Typically healthy at baseline, CC and associate symptoms represents a moderate deviation. There has been no improvement to CC despite use of OTC Tylenol Cod and Flu, Alka Seltzer Cold and Flu, and Nightquil. No congestion, chest pain, SOB, or n/v/d. Pt denies use of tobacco, alcohol, and illicit drug use. Pt denies receiving Flu vaccination.     Past Medical History  Diagnosis Date  . Hypertension   . Asthma     History reviewed. No pertinent past surgical history.  History reviewed. No pertinent family history.  History  Substance Use Topics  . Smoking status: Never Smoker   . Smokeless tobacco: Not on file  . Alcohol Use: No     Review of Systems  Constitutional: Positive for fever and chills.  HENT: Positive for rhinorrhea.   Respiratory: Positive for cough.   Musculoskeletal: Positive for myalgias.  All other systems reviewed and are negative.    Allergies  Review of patient's allergies indicates no known allergies.  Home Medications   Current Outpatient Rx  Name  Route  Sig  Dispense   Refill  . amoxicillin (AMOXIL) 500 MG capsule   Oral   Take 2 capsules (1,000 mg total) by mouth daily.   20 capsule   0   . cephALEXin (KEFLEX) 500 MG capsule   Oral   Take 1 capsule (500 mg total) by mouth 2 (two) times daily.   14 capsule   0   . HYDROcodone-acetaminophen (LORTAB) 7.5-500 MG/15ML solution   Oral   Take 15 mLs by mouth every 6 (six) hours as needed for pain.   120 mL   0   . predniSONE (DELTASONE) 20 MG tablet   Oral   Take 2 tablets (40 mg total) by mouth daily.   8 tablet   0   . promethazine (PHENERGAN) 25 MG tablet   Oral   Take 1 tablet (25 mg total) by mouth every 6 (six) hours as needed for nausea.   20 tablet   0     BP 139/87  Pulse 122  Temp(Src) 101.1 F (38.4 C) (Oral)  Resp 18  SpO2 97%  Physical Exam  Nursing note and vitals reviewed. Constitutional: She is oriented to person, place, and time. She appears well-developed and well-nourished. No distress.  Fibrile  HENT:  Head: Normocephalic and atraumatic.  Right Ear: External ear normal.  Left Ear: External ear normal.  Nose: Nose normal.  Mouth/Throat: Oropharynx is clear and moist. No oropharyngeal exudate.  Bilateral TMs clear and without evidence of infection.  Eyes: Conjunctivae and EOM are normal.  Pupils are equal, round, and reactive to light. Right eye exhibits no discharge. Left eye exhibits no discharge. No scleral icterus.  Neck: Neck supple. No tracheal deviation present.  Cardiovascular: Regular rhythm and normal heart sounds.  Exam reveals no gallop and no friction rub.   No murmur heard. Tachycardic.  Pulmonary/Chest: Effort normal and breath sounds normal. No respiratory distress. She has no wheezes. She has no rales. She exhibits no tenderness.  Abdominal: She exhibits no distension.  Musculoskeletal: Normal range of motion.  Neurological: She is alert and oriented to person, place, and time.  Skin: Skin is warm and dry.  Psychiatric: She has a normal mood  and affect. Her behavior is normal.    ED Course  Procedures DIAGNOSTIC STUDIES: Oxygen Saturation is 97% on room air, normal by my interpretation.    COORDINATION OF CARE: 15:30- Ordered acetaminophen (TYLENOL) tablet 650 mg Once and sodium chloride 0.9 % bolus 1,000 mL STAT. 15:33- Evaluated Pt. Pt is awake, alert, and without distress. Pt has a current temperature of 101.1 15:37- Patient understand and agree with initial ED impression and plan with expectations set for ED visit.  17:06- Rechecked Pt. Pt is feeling improved after receiving fluids and Tylenol. Will prepare Pt for discharge.    Plan: Home Medications- TamiFlu 75 mg capsule every 12 hours.; Home Treatments- Rest; Recommended follow up- follow up with ED or PCP if symptoms do not improve in 2 weeks.   Labs Reviewed - No data to display No results found.   1. Flu-like symptoms       MDM  32 year old female with flu-like illness.  Patient improved with fluids and tylenol.  Will discharge with PCP follow-up.  Tylenol and motrin for fever.  Return precautions given.  Filed Vitals:   10/30/12 1704  BP: 118/75  Pulse: 94  Temp: 99.5 F (37.5 C)  Resp:         I personally performed the services described in this documentation, which was scribed in my presence. The recorded information has been reviewed and is accurate.      Roxy Horseman, PA-C 10/30/12 1757

## 2012-10-30 NOTE — ED Notes (Signed)
Pt c/o flu like sx x 3 days with fever and body aches 

## 2012-11-01 NOTE — ED Provider Notes (Signed)
Medical screening examination/treatment/procedure(s) were performed by non-physician practitioner and as supervising physician I was immediately available for consultation/collaboration.    Jeffey Janssen L Orville Widmann, MD 11/01/12 1512 

## 2012-12-01 ENCOUNTER — Emergency Department (HOSPITAL_COMMUNITY)
Admission: EM | Admit: 2012-12-01 | Discharge: 2012-12-01 | Disposition: A | Discharge status: Home / Self Care | Payer: Self-pay | Attending: Emergency Medicine | Admitting: Emergency Medicine

## 2012-12-01 ENCOUNTER — Encounter (HOSPITAL_COMMUNITY): Payer: Self-pay | Admitting: *Deleted

## 2012-12-01 ENCOUNTER — Telehealth (HOSPITAL_COMMUNITY): Payer: Self-pay | Admitting: Emergency Medicine

## 2012-12-01 DIAGNOSIS — J45909 Unspecified asthma, uncomplicated: Secondary | ICD-10-CM | POA: Insufficient documentation

## 2012-12-01 DIAGNOSIS — M545 Low back pain, unspecified: Secondary | ICD-10-CM | POA: Insufficient documentation

## 2012-12-01 DIAGNOSIS — N390 Urinary tract infection, site not specified: Secondary | ICD-10-CM | POA: Insufficient documentation

## 2012-12-01 DIAGNOSIS — J020 Streptococcal pharyngitis: Secondary | ICD-10-CM | POA: Insufficient documentation

## 2012-12-01 DIAGNOSIS — R51 Headache: Secondary | ICD-10-CM | POA: Insufficient documentation

## 2012-12-01 DIAGNOSIS — Z3202 Encounter for pregnancy test, result negative: Secondary | ICD-10-CM | POA: Insufficient documentation

## 2012-12-01 DIAGNOSIS — I10 Essential (primary) hypertension: Secondary | ICD-10-CM | POA: Insufficient documentation

## 2012-12-01 LAB — URINALYSIS, ROUTINE W REFLEX MICROSCOPIC
Bilirubin Urine: NEGATIVE
Hgb urine dipstick: NEGATIVE
Specific Gravity, Urine: 1.026 (ref 1.005–1.030)
Urobilinogen, UA: 1 mg/dL (ref 0.0–1.0)
pH: 7 (ref 5.0–8.0)

## 2012-12-01 LAB — WET PREP, GENITAL
Trich, Wet Prep: NONE SEEN
Yeast Wet Prep HPF POC: NONE SEEN

## 2012-12-01 MED ORDER — MAGIC MOUTHWASH W/LIDOCAINE: 5.0000 mL | Freq: Three times a day (TID) | ORAL | Status: DC | PRN

## 2012-12-01 MED ORDER — HYDROCODONE-ACETAMINOPHEN 5-325 MG PO TABS: 2.0000 | ORAL_TABLET | ORAL | Status: DC | PRN

## 2012-12-01 MED ORDER — OXYCODONE-ACETAMINOPHEN 5-325 MG PO TABS
1.0000 | ORAL_TABLET | Freq: Once | ORAL | Status: AC
  Administered 2012-12-01: 1 via ORAL
  Filled 2012-12-01: qty 1

## 2012-12-01 MED ORDER — MAGIC MOUTHWASH
5.0000 mL | Freq: Once | ORAL | Status: AC
  Administered 2012-12-01: 5 mL via ORAL
  Filled 2012-12-01: qty 5

## 2012-12-01 MED ORDER — SULFAMETHOXAZOLE-TRIMETHOPRIM 800-160 MG PO TABS: 1.0000 | ORAL_TABLET | Freq: Two times a day (BID) | ORAL | Status: DC

## 2012-12-01 MED ORDER — PENICILLIN G BENZATHINE 1200000 UNIT/2ML IM SUSP
1.2000 10*6.[IU] | Freq: Once | INTRAMUSCULAR | Status: AC
  Administered 2012-12-01: 1.2 10*6.[IU] via INTRAMUSCULAR
  Filled 2012-12-01: qty 2

## 2012-12-01 NOTE — ED Notes (Signed)
Bowie PA at bedside 

## 2012-12-01 NOTE — ED Notes (Signed)
[name] from [facility] pharmacy called about prescription written by Dr Radford Pax didn't have ratio for magic mouthwash. Call transferred to Dr Radford Pax to handle.

## 2012-12-01 NOTE — ED Provider Notes (Signed)
History     CSN: [id number]  Arrival date & time 12/01/12  0730   First MD Initiated Contact with Patient 12/01/12 0848      Chief Complaint  Patient presents with  . sorethroat   . UTI symptoms     (Consider location/radiation/quality/duration/timing/severity/associated sxs/prior treatment) HPI  32 year old female with history of hypertension asthma presents with sore throat and urinary symptoms. Patient reports since last night she has had symptoms of sore throat. Throat is primarily to sore on the right side which radiates to the right ear and down to her right side of neck. She also noticed a nodule on the right side of her neck that is tender. Pain increased with swallowing however she is able to swallow her saliva. Patient has prior history of strep throat and stated that this felt similar. She denies fever, chills, or Raynaud's, sneezing, coughing, chest pain, or shortness of breath. Patient also reports having a strong odor from her urine, ongoing for the past week. She noticed some clear vaginal discharge but denies burning urination, urinary frequency or urgency. She also report having some low back pain for the past week. There has been no specific treatment tried. Patient denies any rash.  Past Medical History  Diagnosis Date  . Hypertension   . Asthma     Past Surgical History  Procedure Laterality Date  . Nasal septum surgery      No family history on file.  History  Substance Use Topics  . Smoking status: Never Smoker   . Smokeless tobacco: Not on file  . Alcohol Use: No    OB History   Grav Para Term Preterm Abortions TAB SAB Ect Mult Living                  Review of Systems  Constitutional:       10 Systems reviewed and all are negative for acute change except as noted in the HPI.     Allergies  Review of patient's allergies indicates no known allergies.  Home Medications   Current Outpatient Rx  Name  Route  Sig  Dispense  Refill  .  acetaminophen (TYLENOL) 500 MG tablet   Oral   Take 1,000 mg by mouth every 6 (six) hours as needed for pain.         Marland Kitchen oseltamivir (TAMIFLU) 75 MG capsule   Oral   Take 1 capsule (75 mg total) by mouth every 12 (twelve) hours.   10 capsule   0     BP 153/102  Pulse 96  Temp(Src) 99 F (37.2 C) (Oral)  Resp 18  SpO2 100%  Physical Exam  Nursing note and vitals reviewed. Constitutional: She is oriented to person, place, and time. She appears well-developed and well-nourished. No distress.  HENT:  Head: Normocephalic and atraumatic.  ear: Left ear with normal TM, nontender. Right ear with normal TM, normal ear canal however tender on exam without any rash, or swelling noted.  Throat: Mild posterior oropharyngeal erythema. Uvula is midline. No tonsillar enlargement or exudates noted. No evidence of deep tissue infection. No trismus.  Eyes: Conjunctivae are normal.  Neck: Normal range of motion. Neck supple.  Cardiovascular: Normal rate and regular rhythm.   Pulmonary/Chest: Effort normal and breath sounds normal. She exhibits no tenderness.  Abdominal: Soft. There is no tenderness.  Genitourinary: Uterus normal. There is no rash or lesion on the right labia. There is no rash or lesion on the left labia. Cervix exhibits  no motion tenderness and no discharge. Right adnexum displays no mass and no tenderness. Left adnexum displays no mass and no tenderness. No erythema, tenderness or bleeding around the vagina. Vaginal discharge (thin white vaginal discharge in vaginal vault with odor) found.  Chaperone present   No CVA tenderness  Lymphadenopathy:    She has cervical adenopathy (right shotty anterior cervical lymphadenopathy on palpation without evidence of abscess or rash.).       Right: No inguinal adenopathy present.       Left: No inguinal adenopathy present.  Neurological: She is alert and oriented to person, place, and time.  Skin: No rash noted.  Psychiatric: She has a  normal mood and affect.    ED Course  Procedures (including critical care time)  9:08 AM Patient complaining of sore throat. She meet Centor criteria suspicious for strep infection. She also complaining of dysuria with strong urine odor and vaginal discharge. Will perform pelvic exam for further evaluation.  11:04 AM No cervical motion tenderness on pelvic exam doubt cervicitis or pelvic inflammatory disease. Patient will be treated for her positive strep throat, and positive UTI. Culture sent. Gc/Chl culture sent.     Labs Reviewed  RAPID STREP SCREEN - Abnormal; Notable for the following:    Streptococcus, Group A Screen (Direct) POSITIVE (*)    All other components within normal limits  WET PREP, GENITAL - Abnormal; Notable for the following:    Clue Cells Wet Prep HPF POC FEW (*)    WBC, Wet Prep HPF POC TOO NUMEROUS TO COUNT (*)    All other components within normal limits  URINALYSIS, ROUTINE W REFLEX MICROSCOPIC - Abnormal; Notable for the following:    APPearance CLOUDY (*)    Nitrite POSITIVE (*)    Leukocytes, UA SMALL (*)    All other components within normal limits  URINE MICROSCOPIC-ADD ON - Abnormal; Notable for the following:    Squamous Epithelial / LPF FEW (*)    Bacteria, UA MANY (*)    All other components within normal limits  GC/CHLAMYDIA PROBE AMP  URINE CULTURE  POCT PREGNANCY, URINE   No results found.   1. Strep pharyngitis   2. UTI (lower urinary tract infection)       MDM  BP 153/102  Pulse 96  Temp(Src) 99 F (37.2 C) (Oral)  Resp 18  SpO2 100%  I have reviewed nursing notes and vital signs.  I reviewed available ER/hospitalization records thought the EMR         Fayrene Helper, [place] 12/01/12 1126

## 2012-12-01 NOTE — ED Notes (Signed)
Pt is here with sorethroat, fever, headache, bilateral ear pain, lower back pain, and urinary s/s

## 2012-12-01 NOTE — ED Notes (Addendum)
Pt c/o (R) side sore throat, (R) ear ache, and lower back pain x1 day, cloudy urine w/strong odor and clear colored vaginal d/c x1 week. Pt reports a co-worker recently dx w/strep throat

## 2012-12-01 NOTE — ED Provider Notes (Signed)
Medical screening examination/treatment/procedure(s) were performed by non-physician practitioner and as supervising physician I was immediately available for consultation/collaboration.    Nelia Shi, MD 12/01/12 1134

## 2012-12-01 NOTE — ED Notes (Signed)
Pt alert and ambulatory, 3 new rx given, [t dc's home w/all belongings, verbalizes understanding of dc instructions

## 2012-12-02 LAB — GC/CHLAMYDIA PROBE AMP: CT Probe RNA: POSITIVE — AB

## 2012-12-03 LAB — URINE CULTURE: Colony Count: >=100000

## 2012-12-04 ENCOUNTER — Telehealth (HOSPITAL_COMMUNITY): Payer: Self-pay | Admitting: Emergency Medicine

## 2012-12-04 NOTE — ED Notes (Signed)
+  Urine. Patient given Bactrim. Resistant. Chart sent to EDP office for review. °

## 2012-12-04 NOTE — ED Notes (Signed)
Patient has +Urine culture. Checking to see if appropriately treated. °

## 2012-12-06 ENCOUNTER — Telehealth (HOSPITAL_COMMUNITY): Payer: Self-pay | Admitting: Emergency Medicine

## 2012-12-06 NOTE — ED Notes (Signed)
Chart returned from EDP office. Per [facility] PA-C, treat for UTI and Chlamydia. UTI: give Cipro 250 mg PO BID x 3 days. Chlamydia: give 1 gram Azithromycin PO x 1 single does.

## 2012-12-10 IMAGING — CR DG CHEST 2V
2 series · 2 of 2 positions shown · non-contrast
Comparison: 03/18/2004

CLINICAL DATA: Shortness of breath, wheezing.

CHEST - 2 VIEW

[view not recorded (1 of 2)]
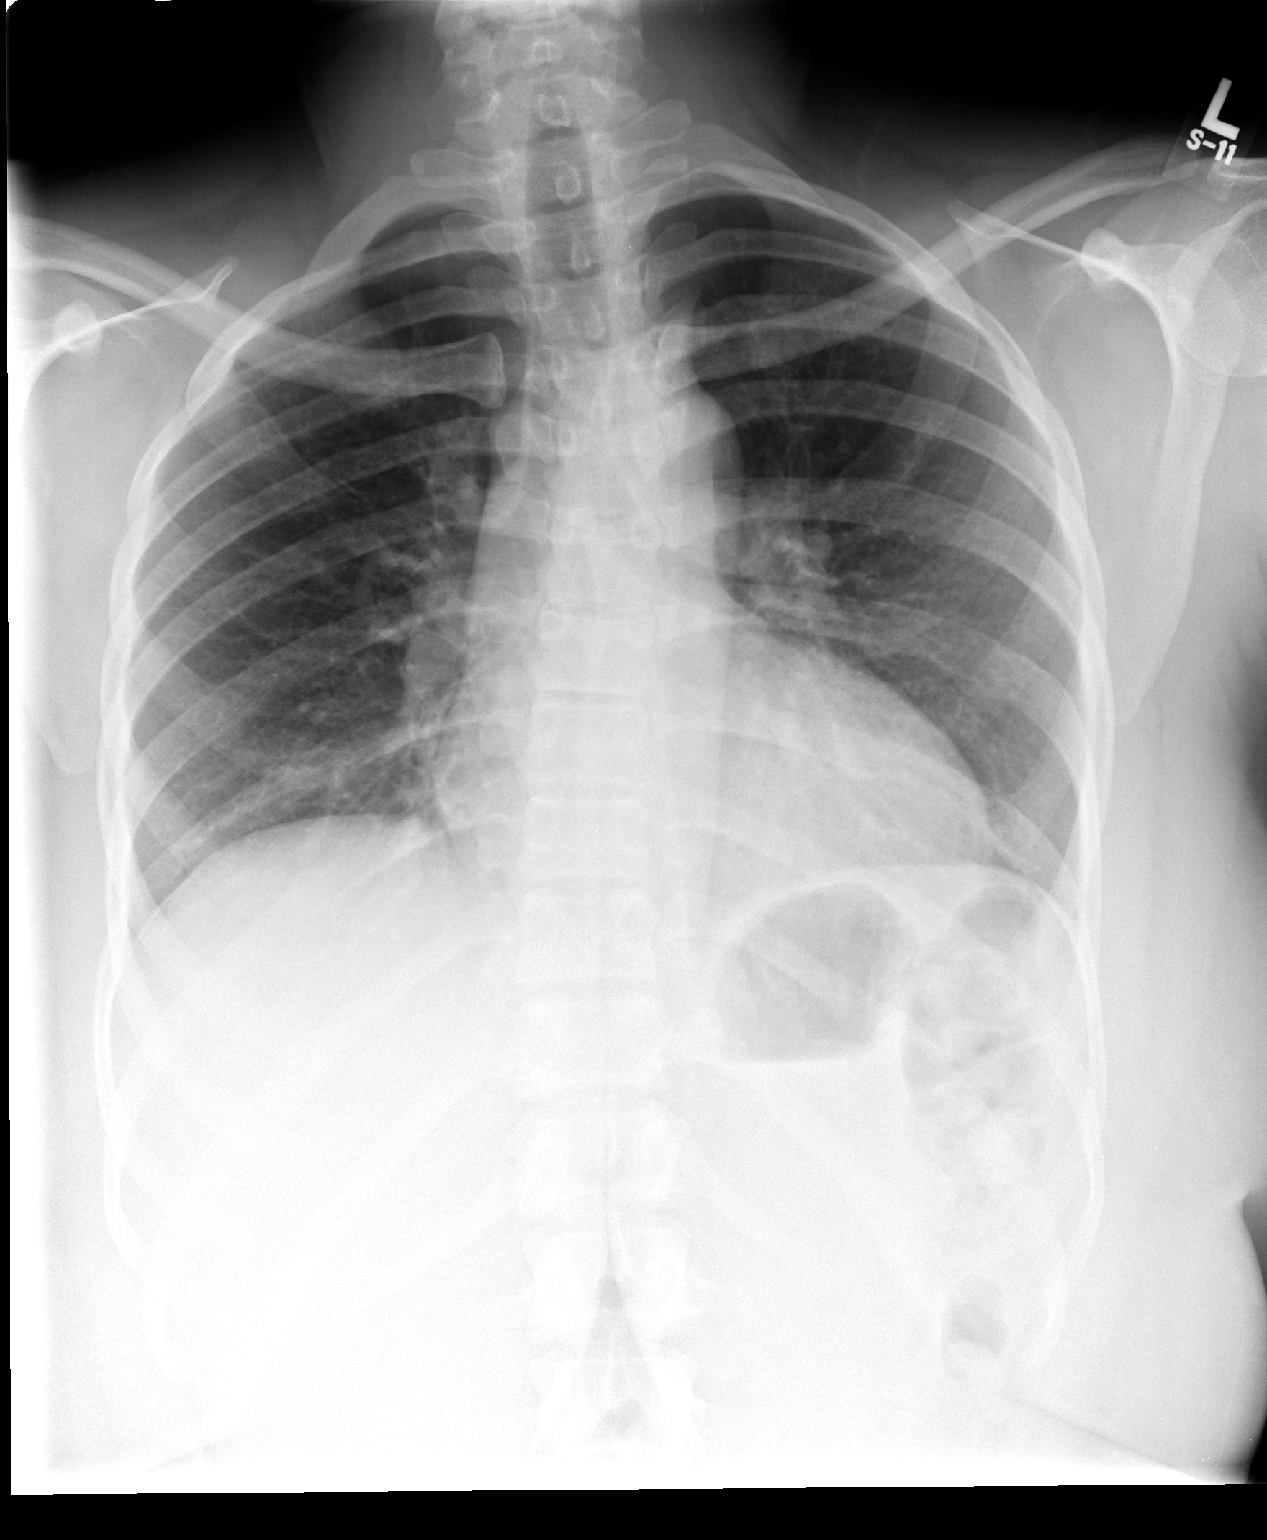

[view not recorded (2 of 2)]
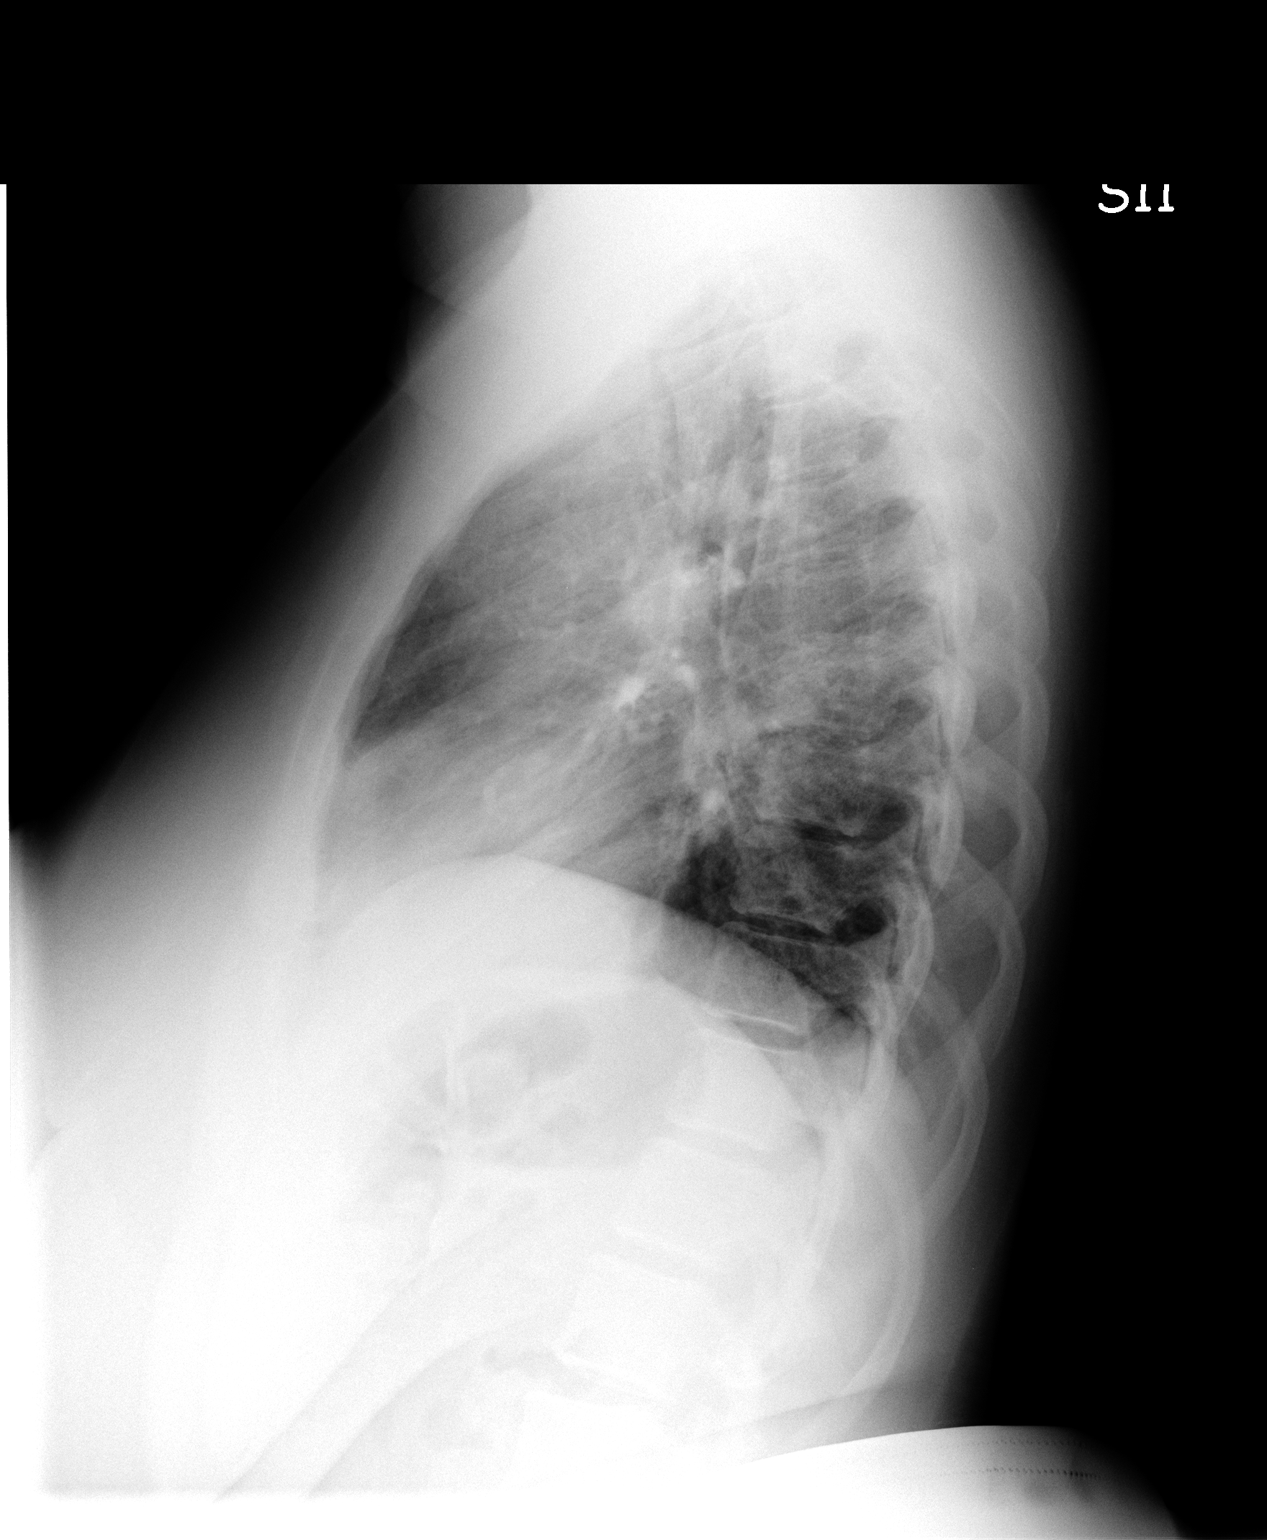

[2 of 2 positions shown; findings below may reference images not displayed]

FINDINGS: Mild bibasilar interstitial prominence.  No pleural
effusion or pneumothorax.  Cardiomediastinal contours are within
normal limits.
IMPRESSION: Mild interstitial prominence without focal consolidation.

## 2013-08-01 ENCOUNTER — Emergency Department (INDEPENDENT_AMBULATORY_CARE_PROVIDER_SITE_OTHER)
Admission: EM | Admit: 2013-08-01 | Discharge: 2013-08-01 | Disposition: A | Discharge status: Home / Self Care | Payer: BC Managed Care – PPO | Source: Home / Self Care

## 2013-08-01 ENCOUNTER — Encounter (HOSPITAL_COMMUNITY): Payer: Self-pay | Admitting: Emergency Medicine

## 2013-08-01 DIAGNOSIS — K05219 Aggressive periodontitis, localized, unspecified severity: Secondary | ICD-10-CM

## 2013-08-01 DIAGNOSIS — K0889 Other specified disorders of teeth and supporting structures: Secondary | ICD-10-CM

## 2013-08-01 DIAGNOSIS — K0520 Aggressive periodontitis, unspecified: Secondary | ICD-10-CM

## 2013-08-01 DIAGNOSIS — K089 Disorder of teeth and supporting structures, unspecified: Secondary | ICD-10-CM

## 2013-08-01 MED ORDER — KETOROLAC TROMETHAMINE 60 MG/2ML IM SOLN
60.0000 mg | Freq: Once | INTRAMUSCULAR | Status: AC
  Administered 2013-08-01: 60 mg via INTRAMUSCULAR

## 2013-08-01 MED ORDER — KETOROLAC TROMETHAMINE 60 MG/2ML IM SOLN
INTRAMUSCULAR | Status: AC
  Filled 2013-08-01: qty 2

## 2013-08-01 MED ORDER — HYDROCODONE-ACETAMINOPHEN 5-325 MG PO TABS: 2.0000 | ORAL_TABLET | ORAL | Status: DC | PRN

## 2013-08-01 MED ORDER — AMOXICILLIN 500 MG PO CAPS: 500.0000 mg | ORAL_CAPSULE | Freq: Two times a day (BID) | ORAL | Status: AC

## 2013-08-01 NOTE — ED Provider Notes (Signed)
Medical screening examination/treatment/procedure(s) were performed by non-physician practitioner and as supervising physician I was immediately available for consultation/collaboration.  Leslee Home, M.D.  Reuben Likes, MD 08/01/13 2024

## 2013-08-01 NOTE — ED Provider Notes (Signed)
CSN: [id number]     Arrival date & time 08/01/13  1325 History   None    Chief Complaint  Patient presents with  . Dental Pain    2wk hx of dental pain. Having pain from upper right tooth, and lower right tooth.  Has taken Ibuprofen for pain, without relief. was prescribed antibiotic for absess of lower right tooth, and finished last week.    (Consider location/radiation/quality/duration/timing/severity/associated sxs/prior Treatment) HPI Comments: Patient presents with dental gum pain. She was evaluated by a Dentist 2 weeks ago for 2 broken teeth and an abscess. She was given 5 days on ABX. Yesterday the pain returned and she reports it as "severe". Ibuprofen not helping. Pain is right upper and lower. No drainage. No fever or chills. "Hurts into my ear". No nausea or vomiting is noted.   Patient is a 32 y.o. female presenting with tooth pain. The history is provided by the patient.  Dental Pain   Past Medical History  Diagnosis Date  . Hypertension   . Asthma    Past Surgical History  Procedure Laterality Date  . Nasal septum surgery     History reviewed. No pertinent family history. History  Substance Use Topics  . Smoking status: Never Smoker   . Smokeless tobacco: Not on file  . Alcohol Use: No   OB History   Grav Para Term Preterm Abortions TAB SAB Ect Mult Living                 Review of Systems  All other systems reviewed and are negative.    Allergies  Review of patient's allergies indicates no known allergies.  Home Medications   Current Outpatient Rx  Name  Route  Sig  Dispense  Refill  . Alum & Mag Hydroxide-Simeth (MAGIC MOUTHWASH W/LIDOCAINE) SOLN   Oral   Take 5 mLs by mouth 3 (three) times daily as needed.   60 mL   0   . amoxicillin (AMOXIL) 500 MG capsule   Oral   Take 1 capsule (500 mg total) by mouth 2 (two) times daily.   21 capsule   0   . diphenhydrAMINE (SOMINEX) 25 MG tablet   Oral   Take 25 mg by mouth every 6 (six) hours as  needed for allergies or sleep.         Marland Kitchen HYDROcodone-acetaminophen (NORCO/VICODIN) 5-325 MG per tablet   Oral   Take 2 tablets by mouth every 4 (four) hours as needed.   10 tablet   0   . sulfamethoxazole-trimethoprim (SEPTRA DS) 800-160 MG per tablet   Oral   Take 1 tablet by mouth every 12 (twelve) hours.   10 tablet   0    BP 123/80  Pulse 67  Temp(Src) 98 F (36.7 C) (Oral)  Resp 16  SpO2 96%  LMP 07/17/2013 Physical Exam  Nursing note and vitals reviewed. Constitutional: She is oriented to person, place, and time. She appears well-developed and well-nourished. She appears distressed.  Slight distress secondary to mouth pain   HENT:  Head: Normocephalic and atraumatic.  Mouth/Throat: Oropharynx is clear and moist.  Slight swelling right cheek, Broken tooth right upper gum, and lower right gum, no pus is noted. Gum is slightly erythematous upper and lower. No fluctuance,. Pain with palpation with tongue depressor  Eyes: Pupils are equal, round, and reactive to light. Right eye exhibits no discharge. Left eye exhibits no discharge.  Neck: Normal range of motion. Neck supple.  Lymphadenopathy:  She has no cervical adenopathy.  Neurological: She is alert and oriented to person, place, and time.  Skin: Skin is warm and dry. She is not diaphoretic. No erythema.  Psychiatric: Her behavior is normal.    ED Course  Procedures (including critical care time) Labs Review Labs Reviewed - No data to display Imaging Review No results found.  EKG Interpretation    Date/Time:    Ventricular Rate:    PR Interval:    QRS Duration:   QT Interval:    QTC Calculation:   R Axis:     Text Interpretation:              MDM   1. Pain, dental   2. Gum abscess    No obvious exudate, though with swelling and erythema along gum line. Treat empirically with antibiotics, and advise dental f/u. Cover with Hydrocodone until f/u.  Seek f/u if worsening swelling or fevers.      Riki Sheer, PA-C 08/01/13 1615  Riki Sheer, PA-C 08/01/13 1630

## 2013-08-01 NOTE — ED Notes (Signed)
2wk hx of dental pain. Having pain from upper right tooth, and lower right tooth.  Has taken Ibuprofen for pain, without relief. was prescribed antibiotic for absess of lower right tooth, and finished last week.

## 2013-10-27 ENCOUNTER — Emergency Department (HOSPITAL_COMMUNITY): Payer: BC Managed Care – PPO

## 2013-10-27 ENCOUNTER — Emergency Department (HOSPITAL_COMMUNITY)
Admission: EM | Admit: 2013-10-27 | Discharge: 2013-10-27 | Disposition: A | Discharge status: Home / Self Care | Payer: BC Managed Care – PPO | Attending: Emergency Medicine | Admitting: Emergency Medicine

## 2013-10-27 ENCOUNTER — Encounter (HOSPITAL_COMMUNITY): Payer: Self-pay | Admitting: Emergency Medicine

## 2013-10-27 DIAGNOSIS — I10 Essential (primary) hypertension: Secondary | ICD-10-CM | POA: Insufficient documentation

## 2013-10-27 DIAGNOSIS — J45901 Unspecified asthma with (acute) exacerbation: Secondary | ICD-10-CM

## 2013-10-27 DIAGNOSIS — Z79899 Other long term (current) drug therapy: Secondary | ICD-10-CM | POA: Insufficient documentation

## 2013-10-27 MED ORDER — IPRATROPIUM-ALBUTEROL 0.5-2.5 (3) MG/3ML IN SOLN
3.0000 mL | Freq: Once | RESPIRATORY_TRACT | Status: AC
  Administered 2013-10-27: 3 mL via RESPIRATORY_TRACT
  Filled 2013-10-27: qty 3

## 2013-10-27 MED ORDER — ALBUTEROL (5 MG/ML) CONTINUOUS INHALATION SOLN
15.0000 mg/h | INHALATION_SOLUTION | Freq: Once | RESPIRATORY_TRACT | Status: AC
  Administered 2013-10-27: 15 mg/h via RESPIRATORY_TRACT
  Filled 2013-10-27: qty 20

## 2013-10-27 MED ORDER — PREDNISONE 20 MG PO TABS: 60.0000 mg | ORAL_TABLET | Freq: Every day | ORAL | Status: DC

## 2013-10-27 MED ORDER — PREDNISONE 20 MG PO TABS
60.0000 mg | ORAL_TABLET | Freq: Once | ORAL | Status: AC
  Administered 2013-10-27: 60 mg via ORAL
  Filled 2013-10-27: qty 3

## 2013-10-27 MED ORDER — IPRATROPIUM-ALBUTEROL 0.5-2.5 (3) MG/3ML IN SOLN
RESPIRATORY_TRACT | Status: AC
  Administered 2013-10-27: 3 mL
  Filled 2013-10-27: qty 3

## 2013-10-27 NOTE — Discharge Instructions (Signed)
Asthma Attack Prevention °Although there is no way to prevent asthma from starting, you can take steps to control the disease and reduce its symptoms. Learn about your asthma and how to control it. Take an active role to control your asthma by working with your health care provider to create and follow an asthma action plan. An asthma action plan guides you in: °· Taking your medicines properly. °· Avoiding things that set off your asthma or make your asthma worse (asthma triggers). °· Tracking your level of asthma control. °· Responding to worsening asthma. °· Seeking emergency care when needed. °To track your asthma, keep records of your symptoms, check your peak flow number using a handheld device that shows how well air moves out of your lungs (peak flow meter), and get regular asthma checkups.  °WHAT ARE SOME WAYS TO PREVENT AN ASTHMA ATTACK? °· Take medicines as directed by your health care provider. °· Keep track of your asthma symptoms and level of control. °· With your health care provider, write a detailed plan for taking medicines and managing an asthma attack. Then be sure to follow your action plan. Asthma is an ongoing condition that needs regular monitoring and treatment. °· Identify and avoid asthma triggers. Many outdoor allergens and irritants (such as pollen, mold, cold air, and air pollution) can trigger asthma attacks. Find out what your asthma triggers are and take steps to avoid them. °· Monitor your breathing. Learn to recognize warning signs of an attack, such as coughing, wheezing, or shortness of breath. Your lung function may decrease before you notice any signs or symptoms, so regularly measure and record your peak airflow with a home peak flow meter. °· Identify and treat attacks early. If you act quickly, you are less likely to have a severe attack. You will also need less medicine to control your symptoms. When your peak flow measurements decrease and alert you to an upcoming attack,  take your medicine as instructed and immediately stop any activity that may have triggered the attack. If your symptoms do not improve, get medical help. °· Pay attention to increasing quick-relief inhaler use. If you find yourself relying on your quick-relief inhaler, your asthma is not under control. See your health care provider about adjusting your treatment. °WHAT CAN MAKE MY SYMPTOMS WORSE? °A number of common things can set off or make your asthma symptoms worse and cause temporary increased inflammation of your airways. Keep track of your asthma symptoms for several weeks, detailing all the environmental and emotional factors that are linked with your asthma. When you have an asthma attack, go back to your asthma diary to see which factor, or combination of factors, might have contributed to it. Once you know what these factors are, you can take steps to control many of them. If you have allergies and asthma, it is important to take asthma prevention steps at home. Minimizing contact with the substance to which you are allergic will help prevent an asthma attack. Some triggers and ways to avoid these triggers are: °Animal Dander:  °Some people are allergic to the flakes of skin or dried saliva from animals with fur or feathers.  °· There is no such thing as a hypoallergenic dog or cat breed. All dogs or cats can cause allergies, even if they don't shed. °· Keep these pets out of your home. °· If you are not able to keep a pet outdoors, keep the pet out of your bedroom and other sleeping areas at all   times, and keep the door closed. °· Remove carpets and furniture covered with cloth from your home. If that is not possible, keep the pet away from fabric-covered furniture and carpets. °Dust Mites: °Many people with asthma are allergic to dust mites. Dust mites are tiny bugs that are found in every home in mattresses, pillows, carpets, fabric-covered furniture, bedcovers, clothes, stuffed toys, and other  fabric-covered items.  °· Cover your mattress in a special dust-proof cover. °· Cover your pillow in a special dust-proof cover, or wash the pillow each week in hot water. Water must be hotter than 130° F (54.4° C) to kill dust mites. Cold or warm water used with detergent and bleach can also be effective. °· Wash the sheets and blankets on your bed each week in hot water. °· Try not to sleep or lie on cloth-covered cushions. °· Call ahead when traveling and ask for a smoke-free hotel room. Bring your own bedding and pillows in case the hotel only supplies feather pillows and down comforters, which may contain dust mites and cause asthma symptoms. °· Remove carpets from your bedroom and those laid on concrete, if you can. °· Keep stuffed toys out of the bed, or wash the toys weekly in hot water or cooler water with detergent and bleach. °Cockroaches: °Many people with asthma are allergic to the droppings and remains of cockroaches.  °· Keep food and garbage in closed containers. Never leave food out. °· Use poison baits, traps, powders, gels, or paste (for example, boric acid). °· If a spray is used to kill cockroaches, stay out of the room until the odor goes away. °Indoor Mold: °· Fix leaky faucets, pipes, or other sources of water that have mold around them. °· Clean floors and moldy surfaces with a fungicide or diluted bleach. °· Avoid using humidifiers, vaporizers, or swamp coolers. These can spread molds through the air. °Pollen and Outdoor Mold: °· When pollen or mold spore counts are high, try to keep your windows closed. °· Stay indoors with windows closed from late morning to afternoon. Pollen and some mold spore counts are highest at that time. °· Ask your health care provider whether you need to take anti-inflammatory medicine or increase your dose of the medicine before your allergy season starts. °Other Irritants to Avoid: °· Tobacco smoke is an irritant. If you smoke, ask your health care provider how  you can quit. Ask family members to quit smoking too. Do not allow smoking in your home or car. °· If possible, do not use a wood-burning stove, kerosene heater, or fireplace. Minimize exposure to all sources of smoke, including to incense, candles, fires, and fireworks. °· Try to stay away from strong odors and sprays, such as perfume, talcum powder, hair spray, and paints. °· Decrease humidity in your home and use an indoor air cleaning device. Reduce indoor humidity to below 60%. Dehumidifiers or central air conditioners can do this. °· Decrease house dust exposure by changing furnace and air cooler filters frequently. °· Try to have someone else vacuum for you once or twice a week. Stay out of rooms while they are being vacuumed and for a short while afterward. °· If you vacuum, use a dust mask from a hardware store, a double-layered or microfilter vacuum cleaner bag, or a vacuum cleaner with a HEPA filter. °· Sulfites in foods and beverages can be irritants. Do not drink beer or wine or eat dried fruit, processed potatoes, or shrimp if they cause asthma symptoms. °·   Cold air can trigger an asthma attack. Cover your nose and mouth with a scarf on cold or windy days. °· Several health conditions can make asthma more difficult to manage, including a runny nose, sinus infections, reflux disease, psychological stress, and sleep apnea. Work with your health care provider to manage these conditions. °· Avoid close contact with people who have a respiratory infection such as a cold or the flu, since your asthma symptoms may get worse if you catch the infection. Wash your hands thoroughly after touching items that may have been handled by people with a respiratory infection. °· Get a flu shot every year to protect against the flu virus, which often makes asthma worse for days or weeks. Also get a pneumonia shot if you have not previously had one. Unlike the flu shot, the pneumonia shot does not need to be given  yearly. °Medicines: °· Talk to your health care provider about whether it is safe for you to take aspirin or non-steroidal anti-inflammatory medicines (NSAIDs). In a small number of people with asthma, aspirin and NSAIDs can cause asthma attacks. These medicines must be avoided by people who have known aspirin-sensitive asthma. It is important that people with aspirin-sensitive asthma read labels of all over-the-counter medicines used to treat pain, colds, coughs, and fever. °· Beta blockers and ACE inhibitors are other medicines you should discuss with your health care provider. °HOW CAN I FIND OUT WHAT I AM ALLERGIC TO? °Ask your asthma health care provider about allergy skin testing or blood testing (the RAST test) to identify the allergens to which you are sensitive. If you are found to have allergies, the most important thing to do is to try to avoid exposure to any allergens that you are sensitive to as much as possible. Other treatments for allergies, such as medicines and allergy shots (immunotherapy) are available.  °CAN I EXERCISE? °Follow your health care provider's advice regarding asthma treatment before exercising. It is important to maintain a regular exercise program, but vigorous exercise, or exercise in cold, humid, or dry environments can cause asthma attacks, especially for those people who have exercise-induced asthma. °Document Released: 08/07/2009 Document Revised: 04/21/2013 Document Reviewed: 02/24/2013 °[employer]® Patient Information ©2014 [employer]. ° °Asthma, Acute Bronchospasm °Acute bronchospasm caused by asthma is also referred to as an asthma attack. Bronchospasm means your air passages become narrowed. The narrowing is caused by inflammation and tightening of the muscles in the air tubes (bronchi) in your lungs. This can make it hard to breath or cause you to wheeze and cough. °CAUSES °Possible triggers are: °· Animal dander from the skin, hair, or feathers of animals. °· Dust  mites contained in house dust. °· Cockroaches. °· Pollen from trees or grass. °· Mold. °· Cigarette or tobacco smoke. °· Air pollutants such as dust, household cleaners, hair sprays, aerosol sprays, paint fumes, strong chemicals, or strong odors. °· Cold air or weather changes. Cold air may trigger inflammation. Winds increase molds and pollens in the air. °· Strong emotions such as crying or laughing hard. °· Stress. °· Certain medicines such as aspirin or beta-blockers. °· Sulfites in foods and drinks, such as dried fruits and wine. °· Infections or inflammatory conditions, such as a flu, cold, or inflammation of the nasal membranes (rhinitis). °· Gastroesophageal reflux disease (GERD). GERD is a condition where stomach acid backs up into your throat (esophagus). °· Exercise or strenuous activity. °SIGNS AND SYMPTOMS  °· Wheezing. °· Excessive coughing, particularly at night. °· Chest tightness. °·   Shortness of breath. °DIAGNOSIS  °Your health care provider will ask you about your medical history and perform a physical exam. A chest X-ray or blood testing may be performed to look for other causes of your symptoms or other conditions that may have triggered your asthma attack.  °TREATMENT  °Treatment is aimed at reducing inflammation and opening up the airways in your lungs.  Most asthma attacks are treated with inhaled medicines. These include quick relief or rescue medicines (such as bronchodilators) and controller medicines (such as inhaled corticosteroids). These medicines are sometimes given through an inhaler or a nebulizer. Systemic steroid medicine taken by mouth or given through an IV tube also can be used to reduce the inflammation when an attack is moderate or severe. Antibiotic medicines are only used if a bacterial infection is present.  °HOME CARE INSTRUCTIONS  °· Rest. °· Drink plenty of liquids. This helps the mucus to remain thin and be easily coughed up. Only use caffeine in moderation and do not  use alcohol until you have recovered from your illness. °· Do not smoke. Avoid being exposed to secondhand smoke. °· You play a critical role in keeping yourself in good health. Avoid exposure to things that cause you to wheeze or to have breathing problems. °· Keep your medicines up to date and available. Carefully follow your health care provider's treatment plan. °· Take your medicine exactly as prescribed. °· When pollen or pollution is bad, keep windows closed and use an air conditioner or go to places with air conditioning. °· Asthma requires careful medical care. See your health care provider for a follow-up as advised. If you are more than [redacted] weeks pregnant and you were prescribed any new medicines, let your obstetrician know about the visit and how you are doing. Follow-up with your health care provider as directed. °· After you have recovered from your asthma attack, make an appointment with your outpatient doctor to talk about ways to reduce the likelihood of future attacks. If you do not have a doctor who manages your asthma, make an appointment with a primary care doctor to discuss your asthma. °SEEK IMMEDIATE MEDICAL CARE IF:  °· You are getting worse. °· You have trouble breathing. If severe, call your local emergency services (911 in the [place] °· You develop chest pain or discomfort. °· You are vomiting. °· You are not able to keep fluids down. °· You are coughing up yellow, green, brown, or bloody sputum. °· You have a fever and your symptoms suddenly get worse. °· You have trouble swallowing. °MAKE SURE YOU:  °· Understand these instructions. °· Will watch your condition. °· Will get help right away if you are not doing well or get worse. °Document Released: 12/04/2006 Document Revised: 04/21/2013 Document Reviewed: 02/24/2013 °[employer]® Patient Information ©2014 [employer]. ° °

## 2013-10-27 NOTE — ED Provider Notes (Signed)
TIME SEEN: 1:20 PM  CHIEF COMPLAINT: Asthma exacerbation  HPI: Patient is a 33 year old female with a history of hypertension, asthma who presents to the emergency department with wheezing that started last night it is not improving with her albuterol. She reports her asthma is normally triggered by changes in weather. She has never been admitted or intubated for her asthma. She has not been on steroids recently. She has had a productive cough but no fever. No lower extremity swelling or pain. No prior history of PE or DVT, recent prolonged immobilization, surgery, fracture, exogenous hormone use or tobacco use.  ROS: See HPI Constitutional: no fever  Eyes: no drainage  ENT: no runny nose   Cardiovascular:  no chest pain  Resp: SOB  GI: no vomiting GU: no dysuria Integumentary: no rash  Allergy: no hives  Musculoskeletal: no leg swelling  Neurological: no slurred speech ROS otherwise negative  PAST MEDICAL HISTORY/PAST SURGICAL HISTORY:  Past Medical History  Diagnosis Date  . Hypertension   . Asthma     MEDICATIONS:  Prior to Admission medications   Medication Sig Start Date End Date Taking? Authorizing Provider  albuterol (PROVENTIL HFA;VENTOLIN HFA) 108 (90 BASE) MCG/ACT inhaler Inhale 1-2 puffs into the lungs every 6 (six) hours as needed for wheezing or shortness of breath.   Yes Historical Provider, MD    ALLERGIES:  No Known Allergies  SOCIAL HISTORY:  History  Substance Use Topics  . Smoking status: Never Smoker   . Smokeless tobacco: Not on file  . Alcohol Use: No    FAMILY HISTORY: No family history on file.  EXAM: BP 151/94  Pulse 95  Resp 22  SpO2 95% CONSTITUTIONAL: Alert and oriented and responds appropriately to questions. Well-appearing; well-nourished, in no respiratory distress HEAD: Normocephalic EYES: Conjunctivae clear, PERRL ENT: normal nose; no rhinorrhea; moist mucous membranes; pharynx without lesions noted NECK: Supple, no meningismus,  no LAD  CARD: RRR; S1 and S2 appreciated; no murmurs, no clicks, no rubs, no gallops RESP: Normal chest excursion without splinting or tachypnea; breath sounds equal bilaterally but there are diffuse wheezing, no rhonchi or rales ABD/GI: Normal bowel sounds; non-distended; soft, non-tender, no rebound, no guarding BACK:  The back appears normal and is non-tender to palpation, there is no CVA tenderness EXT: Normal ROM in all joints; non-tender to palpation; no edema; normal capillary refill; no cyanosis    SKIN: Normal color for age and race; warm NEURO: Moves all extremities equally PSYCH: The patient's mood and manner are appropriate. Grooming and personal hygiene are appropriate.  MEDICAL DECISION MAKING: Patient here with asthma exacerbation. On my examination, patient's oxygen level is 99% on room air. She has no respiratory distress or increased work of breathing. She does have diffuse expiratory wheezing. We'll give continuous albuterol, prednisone. No risk factors for pulmonary embolus.  ED PROGRESS: Patient's chest x-ray is clear. No infiltrate, edema or pneumothorax. She reports feeling much better after continuous albuterol. Her lungs are almost completely clear to auscultation with just mild expiratory wheezing. We'll give a duo neb and reassess. Oxygen saturation 99% on room air. No respiratory distress or increased work of breathing.  3:00 PM  Pt reports she feels much better and ready to be discharged home. Have given return precautions, PCP followup information. She reports she has plenty of albuterol. Will discharge with prescription for steroids. Patient verbalizes understanding and is comfortable plan.    EKG Interpretation    Date/Time:  Wednesday October 27 2013 12:40:42 EST Ventricular  Rate:  99 PR Interval:  142 QRS Duration: 82 QT Interval:  354 QTC Calculation: 454 R Axis:   72 Text Interpretation:  Sinus rhythm with frequent Premature ventricular complexes  Nonspecific ST and T wave abnormality Abnormal ECG Confirmed by Decklan Mau  DO, Stuart Guillen ([id number]) on 10/27/2013 1:22:38 PM              Layla Maw Allia Wiltsey, DO 10/27/13 1500

## 2013-10-27 NOTE — ED Notes (Signed)
Wheezing since last night.

## 2014-06-13 ENCOUNTER — Encounter (HOSPITAL_COMMUNITY): Payer: Self-pay | Admitting: Emergency Medicine

## 2014-06-13 ENCOUNTER — Emergency Department (HOSPITAL_COMMUNITY): Payer: BC Managed Care – PPO

## 2014-06-13 ENCOUNTER — Inpatient Hospital Stay (HOSPITAL_COMMUNITY)
Admission: EM | Admit: 2014-06-13 | Discharge: 2014-06-16 | DRG: 202 | Disposition: A | Discharge status: Home / Self Care | Payer: BC Managed Care – PPO | Attending: Internal Medicine | Admitting: Internal Medicine

## 2014-06-13 DIAGNOSIS — D72829 Elevated white blood cell count, unspecified: Secondary | ICD-10-CM | POA: Diagnosis present

## 2014-06-13 DIAGNOSIS — Z6841 Body Mass Index (BMI) 40.0 and over, adult: Secondary | ICD-10-CM

## 2014-06-13 DIAGNOSIS — D6489 Other specified anemias: Secondary | ICD-10-CM

## 2014-06-13 DIAGNOSIS — D509 Iron deficiency anemia, unspecified: Secondary | ICD-10-CM | POA: Diagnosis present

## 2014-06-13 DIAGNOSIS — J45901 Unspecified asthma with (acute) exacerbation: Secondary | ICD-10-CM | POA: Diagnosis present

## 2014-06-13 DIAGNOSIS — I10 Essential (primary) hypertension: Secondary | ICD-10-CM | POA: Diagnosis present

## 2014-06-13 DIAGNOSIS — K0889 Other specified disorders of teeth and supporting structures: Secondary | ICD-10-CM

## 2014-06-13 DIAGNOSIS — K089 Disorder of teeth and supporting structures, unspecified: Secondary | ICD-10-CM | POA: Diagnosis present

## 2014-06-13 HISTORY — DX: Anemia, unspecified: D64.9

## 2014-06-13 HISTORY — DX: Cardiac murmur, unspecified: R01.1

## 2014-06-13 LAB — CBC
HCT: 37.8 % (ref 36.0–46.0)
HCT: 36.3 % (ref 36.0–46.0)
Hemoglobin: 12 g/dL (ref 12.0–15.0)
Hemoglobin: 11.5 g/dL — ABNORMAL LOW (ref 12.0–15.0)
MCH: 24.5 pg — ABNORMAL LOW (ref 26.0–34.0)
MCH: 24.6 pg — ABNORMAL LOW (ref 26.0–34.0)
MCHC: 31.7 g/dL (ref 30.0–36.0)
MCHC: 31.7 g/dL (ref 30.0–36.0)
MCV: 77.3 fL — ABNORMAL LOW (ref 78.0–100.0)
MCV: 77.6 fL — ABNORMAL LOW (ref 78.0–100.0)
PLATELETS: 270 10*3/uL (ref 150–400)
Platelets: 314 10*3/uL (ref 150–400)
RBC: 4.89 MIL/uL (ref 3.87–5.11)
RBC: 4.68 MIL/uL (ref 3.87–5.11)
RDW: 14.4 % (ref 11.5–15.5)
RDW: 14.5 % (ref 11.5–15.5)
WBC: 10.3 10*3/uL (ref 4.0–10.5)
WBC: 10 10*3/uL (ref 4.0–10.5)

## 2014-06-13 LAB — CREATININE, SERUM: Creatinine, Ser: 0.69 mg/dL (ref 0.50–1.10)

## 2014-06-13 LAB — BASIC METABOLIC PANEL
ANION GAP: 13 (ref 5–15)
BUN: 15 mg/dL (ref 6–23)
CHLORIDE: 102 meq/L (ref 96–112)
CO2: 24 mEq/L (ref 19–32)
Calcium: 9.5 mg/dL (ref 8.4–10.5)
Creatinine, Ser: 0.77 mg/dL (ref 0.50–1.10)
GFR calc Af Amer: >90 mL/min (ref >90)
GFR calc non Af Amer: >90 mL/min (ref >90)
Glucose, Bld: 86 mg/dL (ref 70–99)
POTASSIUM: 4.2 meq/L (ref 3.7–5.3)
Sodium: 139 mEq/L (ref 137–147)

## 2014-06-13 LAB — HEPATIC FUNCTION PANEL
ALT: 12 U/L (ref 0–35)
AST: 15 U/L (ref 0–37)
Albumin: 3.8 g/dL (ref 3.5–5.2)
Alkaline Phosphatase: 76 U/L (ref 39–117)
Bilirubin, Direct: <0.2 mg/dL (ref 0.0–0.3)
TOTAL PROTEIN: 8 g/dL (ref 6.0–8.3)
Total Bilirubin: 0.4 mg/dL (ref 0.3–1.2)

## 2014-06-13 LAB — TROPONIN I

## 2014-06-13 LAB — TSH: TSH: 0.842 u[IU]/mL (ref 0.350–4.500)

## 2014-06-13 MED ORDER — IPRATROPIUM BROMIDE 0.02 % IN SOLN
0.5000 mg | Freq: Four times a day (QID) | RESPIRATORY_TRACT | Status: DC
  Administered 2014-06-13: 0.5 mg via RESPIRATORY_TRACT
  Filled 2014-06-13 (×2): qty 2.5

## 2014-06-13 MED ORDER — HYDROCODONE-ACETAMINOPHEN 5-325 MG PO TABS
2.0000 | ORAL_TABLET | Freq: Once | ORAL | Status: AC
  Administered 2014-06-13: 2 via ORAL
  Filled 2014-06-13: qty 2

## 2014-06-13 MED ORDER — IPRATROPIUM-ALBUTEROL 0.5-2.5 (3) MG/3ML IN SOLN
3.0000 mL | Freq: Once | RESPIRATORY_TRACT | Status: AC
  Administered 2014-06-13: 3 mL via RESPIRATORY_TRACT
  Filled 2014-06-13: qty 3

## 2014-06-13 MED ORDER — LEVALBUTEROL HCL 0.63 MG/3ML IN NEBU: 0.6300 mg | INHALATION_SOLUTION | Freq: Four times a day (QID) | RESPIRATORY_TRACT | Status: DC

## 2014-06-13 MED ORDER — ONDANSETRON HCL 4 MG/2ML IJ SOLN
4.0000 mg | Freq: Four times a day (QID) | INTRAMUSCULAR | Status: DC | PRN
  Administered 2014-06-14: 4 mg via INTRAVENOUS
  Filled 2014-06-13: qty 2

## 2014-06-13 MED ORDER — IPRATROPIUM BROMIDE 0.02 % IN SOLN
1.0000 mg | Freq: Once | RESPIRATORY_TRACT | Status: AC
  Administered 2014-06-13: 1 mg via RESPIRATORY_TRACT
  Filled 2014-06-13: qty 5

## 2014-06-13 MED ORDER — IPRATROPIUM-ALBUTEROL 0.5-2.5 (3) MG/3ML IN SOLN: 3.0000 mL | RESPIRATORY_TRACT | Status: DC | PRN

## 2014-06-13 MED ORDER — ENOXAPARIN SODIUM 40 MG/0.4ML ~~LOC~~ SOLN
40.0000 mg | SUBCUTANEOUS | Status: DC
  Filled 2014-06-13 (×3): qty 0.4

## 2014-06-13 MED ORDER — ALBUTEROL SULFATE (2.5 MG/3ML) 0.083% IN NEBU: 2.5000 mg | INHALATION_SOLUTION | RESPIRATORY_TRACT | Status: AC | PRN

## 2014-06-13 MED ORDER — DEXTROSE 5 % IV SOLN
500.0000 mg | INTRAVENOUS | Status: DC
  Administered 2014-06-13 – 2014-06-15 (×3): 500 mg via INTRAVENOUS
  Filled 2014-06-13 (×5): qty 500

## 2014-06-13 MED ORDER — ACETAMINOPHEN 325 MG PO TABS: 650.0000 mg | ORAL_TABLET | Freq: Four times a day (QID) | ORAL | Status: DC | PRN

## 2014-06-13 MED ORDER — SODIUM CHLORIDE 0.9 % IJ SOLN
3.0000 mL | Freq: Two times a day (BID) | INTRAMUSCULAR | Status: DC
  Administered 2014-06-13 – 2014-06-16 (×5): 3 mL via INTRAVENOUS

## 2014-06-13 MED ORDER — MOMETASONE FURO-FORMOTEROL FUM 200-5 MCG/ACT IN AERO
2.0000 | INHALATION_SPRAY | Freq: Two times a day (BID) | RESPIRATORY_TRACT | Status: DC
  Administered 2014-06-13 – 2014-06-16 (×6): 2 via RESPIRATORY_TRACT
  Filled 2014-06-13: qty 8.8

## 2014-06-13 MED ORDER — ALBUTEROL (5 MG/ML) CONTINUOUS INHALATION SOLN
10.0000 mg/h | INHALATION_SOLUTION | RESPIRATORY_TRACT | Status: DC
  Administered 2014-06-13: 10 mg/h via RESPIRATORY_TRACT
  Filled 2014-06-13: qty 20

## 2014-06-13 MED ORDER — ACETAMINOPHEN 650 MG RE SUPP: 650.0000 mg | Freq: Four times a day (QID) | RECTAL | Status: DC | PRN

## 2014-06-13 MED ORDER — METHYLPREDNISOLONE SODIUM SUCC 125 MG IJ SOLR
125.0000 mg | Freq: Once | INTRAMUSCULAR | Status: AC
  Administered 2014-06-13: 125 mg via INTRAVENOUS
  Filled 2014-06-13: qty 2

## 2014-06-13 MED ORDER — HYDROCODONE-ACETAMINOPHEN 5-325 MG PO TABS
1.0000 | ORAL_TABLET | Freq: Once | ORAL | Status: AC
  Administered 2014-06-13: 1 via ORAL
  Filled 2014-06-13: qty 1

## 2014-06-13 MED ORDER — HYDROCODONE-ACETAMINOPHEN 5-325 MG PO TABS
1.0000 | ORAL_TABLET | ORAL | Status: DC | PRN
  Filled 2014-06-13: qty 2

## 2014-06-13 MED ORDER — KETOROLAC TROMETHAMINE 30 MG/ML IJ SOLN
30.0000 mg | Freq: Three times a day (TID) | INTRAMUSCULAR | Status: DC | PRN
  Administered 2014-06-13: 30 mg via INTRAVENOUS
  Filled 2014-06-13: qty 1

## 2014-06-13 MED ORDER — METHYLPREDNISOLONE SODIUM SUCC 125 MG IJ SOLR
60.0000 mg | Freq: Four times a day (QID) | INTRAMUSCULAR | Status: DC
  Administered 2014-06-13 – 2014-06-14 (×3): 60 mg via INTRAVENOUS
  Filled 2014-06-13 (×6): qty 0.96

## 2014-06-13 MED ORDER — LIDOCAINE VISCOUS 2 % MT SOLN
15.0000 mL | OROMUCOSAL | Status: DC | PRN
  Administered 2014-06-14: 15 mL via OROMUCOSAL
  Filled 2014-06-13: qty 15

## 2014-06-13 MED ORDER — ONDANSETRON HCL 4 MG PO TABS: 4.0000 mg | ORAL_TABLET | Freq: Four times a day (QID) | ORAL | Status: DC | PRN

## 2014-06-13 NOTE — ED Provider Notes (Signed)
CSN: [id number]     Arrival date & time 06/13/14  1043 History   First MD Initiated Contact with Patient 06/13/14 1408     Chief Complaint  Patient presents with  . Shortness of Breath     (Consider location/radiation/quality/duration/timing/severity/associated sxs/prior Treatment) Patient is a 33 y.o. female presenting with shortness of breath.  Shortness of Breath Severity:  Severe Onset quality:  Gradual Duration:  2 weeks Timing:  Constant Progression:  Worsening Chronicity:  Recurrent Context: URI   Relieved by:  Nothing Worsened by:  Activity and deep breathing Ineffective treatments:  Inhaler Associated symptoms: chest pain, cough, fever, sputum production and wheezing   Associated symptoms: no abdominal pain, no headaches, no rash, no sore throat and no vomiting   Risk factors: obesity     Past Medical History  Diagnosis Date  . Asthma   . Hypertension     pregnancy   Past Surgical History  Procedure Laterality Date  . Nasal septum surgery     No family history on file. History  Substance Use Topics  . Smoking status: Never Smoker   . Smokeless tobacco: Not on file  . Alcohol Use: No   OB History   Grav Para Term Preterm Abortions TAB SAB Ect Mult Living                 Review of Systems  Constitutional: Positive for fever. Negative for chills.  HENT: Positive for dental problem. Negative for congestion and sore throat.   Eyes: Negative for visual disturbance.  Respiratory: Positive for cough, sputum production, chest tightness, shortness of breath and wheezing.   Cardiovascular: Positive for chest pain.  Gastrointestinal: Negative for nausea, vomiting, abdominal pain, diarrhea and constipation.  Genitourinary: Negative for dysuria, difficulty urinating and vaginal pain.  Musculoskeletal: Negative for arthralgias and myalgias.  Skin: Negative for rash.  Neurological: Negative for syncope and headaches.  Psychiatric/Behavioral: Negative for  behavioral problems.  All other systems reviewed and are negative.     Allergies  Review of patient's allergies indicates no known allergies.  Home Medications   Prior to Admission medications   Medication Sig Start Date End Date Taking? Authorizing Provider  albuterol (PROVENTIL HFA;VENTOLIN HFA) 108 (90 BASE) MCG/ACT inhaler Inhale 1-2 puffs into the lungs every 6 (six) hours as needed for wheezing or shortness of breath.   Yes Historical Provider, MD  Chlorpheniramine Maleate (CHLOR-TABLETS PO) Take 1 tablet by mouth daily as needed (for allergies).   Yes Historical Provider, MD   BP 143/91  Pulse 83  Temp(Src) 98.6 F (37 C) (Oral)  Resp 28  Ht 5\' 4"  (1.626 m)  Wt 200 lb (90.719 kg)  BMI 34.31 kg/m2  SpO2 98%  LMP 06/11/2014 Physical Exam  Vitals reviewed. Constitutional: She is oriented to person, place, and time. She appears well-developed and well-nourished. No distress.  HENT:  Head: Normocephalic and atraumatic.  Eyes: EOM are normal.  Neck: Normal range of motion.  Cardiovascular: Normal rate, regular rhythm and normal heart sounds.   No murmur heard. Pulmonary/Chest: Tachypnea noted. She has wheezes.  Abdominal: Soft. There is no tenderness.  Musculoskeletal: She exhibits no edema.  Neurological: She is alert and oriented to person, place, and time.  Skin: She is not diaphoretic.  Psychiatric: She has a normal mood and affect. Her behavior is normal.    ED Course  Procedures (including critical care time) Labs Review Labs Reviewed  CBC - Abnormal; Notable for the following:    MCV 77.3 (*)  MCH 24.5 (*)    All other components within normal limits  BASIC METABOLIC PANEL    Imaging Review Dg Chest 2 View (if Patient Has Fever And/or Copd)  06/13/2014   CLINICAL DATA:  33 year old female with acute cough and shortness of Breath. Initial encounter.  EXAM: CHEST  2 VIEW  COMPARISON:  10/27/2013 and earlier.  FINDINGS: Chronic large body habitus and  somewhat low lung volumes. Normal cardiac size and mediastinal contours. Visualized tracheal air column is within normal limits. Aside from crowding of markings, the lungs are clear. No pneumothorax or effusion. Mild scoliosis.  IMPRESSION: Chronically low lung volumes, otherwise no acute cardiopulmonary abnormality.   Electronically Signed   By: Augusto GambleLee  Hall M.D.   On: 06/13/2014 11:48     EKG Interpretation None      MDM   Final diagnoses:  Asthma exacerbation    Patient is a 33 year old female with a history of asthma that presents with 2 weeks of shortness of breath. Patient states that she had a URI at the beginning of her dyspnea and has had increased wheezing despite inhaler use. Arrival to the ED patient had a oxygen saturation of 91% was given a duo neb in triage and she had some improvement of symptoms however is still wheezing. Patient's saturation at this time her 100% on room air. Patient's chest x-ray shows no significant findings. Patient does not appear in respiratory distress this time. We will place the patient on continuous breathing treatment and give 125 mg solumedrol and reassess.  Display 1 hour continuous albuterol and Solu-Medrol, patient still has very significant wheezing bilateral. We'll admit the patient for further breathing treatments and management of care.    Beverely Risenennis Scharlene Catalina, MD 06/13/14 [phone]

## 2014-06-13 NOTE — ED Notes (Signed)
Pt reports being sick for past 2 weeks with cough and cold symptoms. Pt reports coughing up yellow colored mucous and episode of coughing up blood.

## 2014-06-13 NOTE — H&P (Signed)
[facility] History and Physical  [name] WUJ:[id number] DOB: 12/30/1980 DOA: 06/13/2014  Referring physician:  PCP: Default, Provider, MD   Chief Complaint: 33 y.o. female presenting with shortness of breath.    HPI:  Patient is a 33 year old female with a history of hypertension, asthma who presents to the emergency department with wheezing  not improving with her albuterol. Pt reports being sick for past 2 weeks with cough and cold symptoms. Pt reports coughing up yellow colored mucous and episode of coughing up blood. She reports her asthma is normally triggered by changes in weather. She has never been admitted or intubated for her asthma. She has not been on steroids recently. She has had a productive cough but no fever. No lower extremity swelling or pain. No prior history of PE or DVT, recent prolonged immobilization, surgery, fracture, exogenous hormone use or tobacco use.  Patient's chest x-ray shows no significant findings. Patient does not appear in respiratory distress this time, Despite 1 hour continuous albuterol and Solu-Medrol, patient still has very significant wheezing bilateral.    Review of Systems: negative for the following  Review of Systems  Constitutional: Positive for fever. Negative for chills.  HENT: Positive for dental problem. Negative for congestion and sore throat.  Eyes: Negative for visual disturbance.  Respiratory: Positive for cough, sputum production, chest tightness, shortness of breath and wheezing.  Cardiovascular: Positive for chest pain.  Gastrointestinal: Negative for nausea, vomiting, abdominal pain, diarrhea and constipation.  Genitourinary: Negative for dysuria, difficulty urinating and vaginal pain.  Musculoskeletal: Negative for arthralgias and myalgias.  Skin: Negative for rash.  Neurological: Negative for syncope and headaches.  Psychiatric/Behavioral: Negative for behavioral problems.        Past Medical History   Diagnosis Date  . Asthma   . Hypertension     pregnancy     Past Surgical History  Procedure Laterality Date  . Nasal septum surgery        Social History:  reports that she has never smoked. She does not have any smokeless tobacco history on file. She reports that she does not drink alcohol or use illicit drugs.    No Known Allergies  No family history on file.   Prior to Admission medications   Medication Sig Start Date End Date Taking? Authorizing Provider  albuterol (PROVENTIL HFA;VENTOLIN HFA) 108 (90 BASE) MCG/ACT inhaler Inhale 1-2 puffs into the lungs every 6 (six) hours as needed for wheezing or shortness of breath.   Yes Historical Provider, MD  Chlorpheniramine Maleate (CHLOR-TABLETS PO) Take 1 tablet by mouth daily as needed (for allergies).   Yes Historical Provider, MD     Physical Exam: Filed Vitals:   06/13/14 1047 06/13/14 1404 06/13/14 1437 06/13/14 1553  BP: 164/121 143/91  143/79  Pulse: 112 83  100  Temp: 98.4 F (36.9 C) 98.6 F (37 C)    TempSrc: Oral Oral    Resp: 26 28  18   Height: 5\' 4"  (1.626 m)     Weight: 90.719 kg (200 lb)     SpO2: 91% 100% 98% 95%     Constitutional: Vital signs reviewed. Patient is a well-developed and well-nourished in no acute distress and cooperative with exam. Alert and oriented x3.  Head: Normocephalic and atraumatic  Ear: TM normal bilaterally  Mouth: no erythema or exudates, MMM  Eyes: PERRL, EOMI, conjunctivae normal, No scleral icterus.  Neck: Supple, Trachea midline normal ROM, No JVD, mass, thyromegaly, or carotid bruit present.  Cardiovascular: RRR,  S1 normal, S2 normal, no MRG, pulses symmetric and intact bilaterally  Pulmonary/Chest: Pulmonary/Chest: Tachypnea noted. She has wheezes Abdominal: Soft. Non-tender, non-distended, bowel sounds are normal, no masses, organomegaly, or guarding present.  GU: no CVA tenderness Musculoskeletal: No joint deformities, erythema, or stiffness, ROM full and no  nontender Ext: no edema and no cyanosis, pulses palpable bilaterally (DP and PT)  Hematology: no cervical, inginal, or axillary adenopathy.  Neurological: A&O x3, Strenght is normal and symmetric bilaterally, cranial nerve II-XII are grossly intact, no focal motor deficit, sensory intact to light touch bilaterally.  Skin: Warm, dry and intact. No rash, cyanosis, or clubbing.  Psychiatric: Normal mood and affect. speech and behavior is normal. Judgment and thought content normal. Cognition and memory are normal.       Labs on Admission:    Basic Metabolic Panel:  Recent Labs Lab 06/13/14 1100  NA 139  K 4.2  CL 102  CO2 24  GLUCOSE 86  BUN 15  CREATININE 0.77  CALCIUM 9.5   Liver Function Tests: No results found for this basename: AST, ALT, ALKPHOS, BILITOT, PROT, ALBUMIN,  in the last 168 hours No results found for this basename: LIPASE, AMYLASE,  in the last 168 hours No results found for this basename: AMMONIA,  in the last 168 hours CBC:  Recent Labs Lab 06/13/14 1100  WBC 10.3  HGB 12.0  HCT 37.8  MCV 77.3*  PLT 314   Cardiac Enzymes: No results found for this basename: CKTOTAL, CKMB, CKMBINDEX, TROPONINI,  in the last 168 hours  BNP (last 3 results) No results found for this basename: PROBNP,  in the last 8760 hours    CBG: No results found for this basename: GLUCAP,  in the last 168 hours  Radiological Exams on Admission: Dg Chest 2 View (if Patient Has Fever And/or Copd)  06/13/2014   CLINICAL DATA:  33 year old female with acute cough and shortness of Breath. Initial encounter.  EXAM: CHEST  2 VIEW  COMPARISON:  10/27/2013 and earlier.  FINDINGS: Chronic large body habitus and somewhat low lung volumes. Normal cardiac size and mediastinal contours. Visualized tracheal air column is within normal limits. Aside from crowding of markings, the lungs are clear. No pneumothorax or effusion. Mild scoliosis.  IMPRESSION: Chronically low lung volumes, otherwise  no acute cardiopulmonary abnormality.   Electronically Signed   By: Augusto GambleLee  Hall M.D.   On: 06/13/2014 11:48    EKG: Independently reviewed.   Assessment/Plan Active Problems:   Asthma exacerbation   Acute asthma exacerbation    Acute asthma exacerbation Started on nebs, iv solumedrol Azithromycin for 5 days Influenza PCR Started on dulera  Patient has not had a PCP  In 3 years  [facility]  Toothache due to chipped tooth Start toradol   Code Status:   full Family Communication: bedside Disposition Plan: admit   Time spent: 70 mins   [facility] CenterBROL,Miette Molenda [facility] Pager [phone]  If 7PM-7AM, please contact night-coverage [email or web address] Password [email or web address] 06/13/2014, 4:21 PM

## 2014-06-14 DIAGNOSIS — D6489 Other specified anemias: Secondary | ICD-10-CM

## 2014-06-14 LAB — CBC
HCT: 35.8 % — ABNORMAL LOW (ref 36.0–46.0)
Hemoglobin: 11.4 g/dL — ABNORMAL LOW (ref 12.0–15.0)
MCH: 24.6 pg — AB (ref 26.0–34.0)
MCHC: 31.8 g/dL (ref 30.0–36.0)
MCV: 77.3 fL — AB (ref 78.0–100.0)
PLATELETS: 314 10*3/uL (ref 150–400)
RBC: 4.63 MIL/uL (ref 3.87–5.11)
RDW: 14.5 % (ref 11.5–15.5)
WBC: 13.6 10*3/uL — ABNORMAL HIGH (ref 4.0–10.5)

## 2014-06-14 LAB — COMPREHENSIVE METABOLIC PANEL
ALT: 11 U/L (ref 0–35)
AST: 12 U/L (ref 0–37)
Albumin: 3.6 g/dL (ref 3.5–5.2)
Alkaline Phosphatase: 73 U/L (ref 39–117)
Anion gap: 13 (ref 5–15)
BILIRUBIN TOTAL: 0.2 mg/dL — AB (ref 0.3–1.2)
BUN: 13 mg/dL (ref 6–23)
CHLORIDE: 101 meq/L (ref 96–112)
CO2: 21 mEq/L (ref 19–32)
CREATININE: 0.64 mg/dL (ref 0.50–1.10)
Calcium: 9.5 mg/dL (ref 8.4–10.5)
GFR calc non Af Amer: >90 mL/min (ref >90)
GLUCOSE: 157 mg/dL — AB (ref 70–99)
Potassium: 4 mEq/L (ref 3.7–5.3)
Sodium: 135 mEq/L — ABNORMAL LOW (ref 137–147)
Total Protein: 7.8 g/dL (ref 6.0–8.3)

## 2014-06-14 LAB — TROPONIN I: Troponin I: <0.3 ng/mL (ref <0.30)

## 2014-06-14 LAB — INFLUENZA PANEL BY PCR (TYPE A & B)
H1N1FLUPCR: NOT DETECTED
Influenza A By PCR: NEGATIVE
Influenza B By PCR: NEGATIVE

## 2014-06-14 MED ORDER — ALBUTEROL SULFATE (2.5 MG/3ML) 0.083% IN NEBU
INHALATION_SOLUTION | RESPIRATORY_TRACT | Status: AC
  Filled 2014-06-14: qty 3

## 2014-06-14 MED ORDER — HYDROCOD POLST-CHLORPHEN POLST 10-8 MG/5ML PO LQCR
5.0000 mL | Freq: Every day | ORAL | Status: DC
  Administered 2014-06-14 – 2014-06-15 (×2): 5 mL via ORAL
  Filled 2014-06-14 (×2): qty 5

## 2014-06-14 MED ORDER — GI COCKTAIL ~~LOC~~
30.0000 mL | Freq: Once | ORAL | Status: AC
  Administered 2014-06-14: 30 mL via ORAL
  Filled 2014-06-14: qty 30

## 2014-06-14 MED ORDER — ALBUTEROL SULFATE (2.5 MG/3ML) 0.083% IN NEBU
2.5000 mg | INHALATION_SOLUTION | RESPIRATORY_TRACT | Status: AC | PRN
  Administered 2014-06-14: 2.5 mg via RESPIRATORY_TRACT

## 2014-06-14 MED ORDER — IPRATROPIUM-ALBUTEROL 0.5-2.5 (3) MG/3ML IN SOLN
3.0000 mL | Freq: Three times a day (TID) | RESPIRATORY_TRACT | Status: DC
  Administered 2014-06-14 – 2014-06-16 (×6): 3 mL via RESPIRATORY_TRACT
  Filled 2014-06-14 (×6): qty 3

## 2014-06-14 MED ORDER — PROMETHAZINE HCL 25 MG/ML IJ SOLN
12.5000 mg | Freq: Four times a day (QID) | INTRAMUSCULAR | Status: DC | PRN
  Administered 2014-06-14 – 2014-06-16 (×5): 12.5 mg via INTRAVENOUS
  Filled 2014-06-14 (×5): qty 1

## 2014-06-14 MED ORDER — HYDROCODONE-ACETAMINOPHEN 5-325 MG PO TABS
2.0000 | ORAL_TABLET | ORAL | Status: DC | PRN
  Administered 2014-06-14 – 2014-06-16 (×5): 2 via ORAL
  Filled 2014-06-14 (×4): qty 2

## 2014-06-14 MED ORDER — FLUTICASONE PROPIONATE 50 MCG/ACT NA SUSP
1.0000 | Freq: Every day | NASAL | Status: DC
  Filled 2014-06-14: qty 16

## 2014-06-14 MED ORDER — METHYLPREDNISOLONE SODIUM SUCC 125 MG IJ SOLR
60.0000 mg | Freq: Three times a day (TID) | INTRAMUSCULAR | Status: DC
  Administered 2014-06-14 – 2014-06-15 (×2): 60 mg via INTRAVENOUS
  Filled 2014-06-14 (×5): qty 0.96

## 2014-06-14 MED ORDER — LORATADINE 10 MG PO TABS
10.0000 mg | ORAL_TABLET | Freq: Every day | ORAL | Status: DC
  Administered 2014-06-14 – 2014-06-16 (×3): 10 mg via ORAL
  Filled 2014-06-14 (×3): qty 1

## 2014-06-14 MED ORDER — IBUPROFEN 800 MG PO TABS
800.0000 mg | ORAL_TABLET | Freq: Four times a day (QID) | ORAL | Status: DC | PRN
  Administered 2014-06-14: 800 mg via ORAL
  Filled 2014-06-14 (×2): qty 1

## 2014-06-14 MED ORDER — IPRATROPIUM BROMIDE 0.02 % IN SOLN
0.5000 mg | Freq: Three times a day (TID) | RESPIRATORY_TRACT | Status: DC
  Administered 2014-06-14: 0.5 mg via RESPIRATORY_TRACT
  Filled 2014-06-14: qty 2.5

## 2014-06-14 MED ORDER — OXYMETAZOLINE HCL 0.05 % NA SOLN
1.0000 | Freq: Two times a day (BID) | NASAL | Status: DC
  Filled 2014-06-14: qty 15

## 2014-06-14 NOTE — Progress Notes (Signed)
Patient was assessed by RT this AM. However, at time of treatment, patient was wheezing. I am ordering DUO nebs TID instead of Atrovent TID due to patient needing a bronchodilator at this time.  Will continue to monitor patient status and reassess in 2 days.

## 2014-06-14 NOTE — Progress Notes (Addendum)
PROGRESS NOTE  [name] P Nabozny ZOX:[id number] DOB: 03/18/1981 DOA: 06/13/2014 PCP: No PCP Per Patient  HPI/Subjective: [name] is a 33 year old female with history of asthma who presented 10/12 with asthma exacerbation with blood in her sputum. She reports 1-2 week history of URI prior to admission, and states that she experiences exacerbations when the weather changes. She uses only an albuterol inhaler at home, and denies needing it often, and reports not needing it once last month. Today she still continues to feel tightness in her chest and is wheezing.   Assessment/Plan:  Acute asthma exacerbation  -Continue nebs, iv solumedrol, Azithromycin (day 2), Dulera  -Quite congested will add claritin, flonase, and afrin. -CXR clear, Influenza PCR negative -Patient reports not having PCP for 3 years, [facility]   Toothache  -Secondary to multiple broken teeth.  -Continue toradol, norco -Referral to dentist outpatient  Microcytic anemia -mild -check iron / ferritin, consider starting iron supplementation. -Stable for further work up out patient if deemed necessary.   DVT Prophylaxis: Lovenox   Code Status: Full Family Communication: Patient alert and oriented Disposition Plan: Home when able   Consultants:  None  Procedures:  None  Antibiotics: Anti-infectives   Start     Dose/Rate Route Frequency Ordered Stop   06/13/14 1930  azithromycin (ZITHROMAX) 500 mg in dextrose 5 % 250 mL IVPB     500 mg 250 mL/hr over 60 Minutes Intravenous Every 24 hours 06/13/14 1627         Objective: Filed Vitals:   06/13/14 2131 06/13/14 2151 06/14/14 0512 06/14/14 0929  BP:  127/81 135/89   Pulse:  112 104   Temp:  97.9 F (36.6 C) 98.2 F (36.8 C)   TempSrc:  Oral Oral   Resp:  16 18   Height:      Weight:      SpO2: 97% 97% 96% 97%    Intake/Output Summary (Last 24 hours) at 06/14/14 1147 Last data filed at 06/14/14 0935  Gross  per 24 hour  Intake    363 ml  Output      0 ml  Net    363 ml   Filed Weights   06/13/14 1047 06/13/14 1807  Weight: 90.719 kg (200 lb) 108.863 kg (240 lb)    Exam: General: Well developed, obese female lying in bed in NAD, appears stated age  Cardiovascular: RRR, S1 S2 auscultated, no rubs, clicks, murmurs or gallops.   Respiratory:Wheezes audible bilaterally, no rales or rhonchi  Abdomen: Soft, nontender, nondistended Extremities: warm dry without cyanosis clubbing or edema.  Neuro: AAOx3, cranial nerves grossly intact.   Psych: Pleasant with normal affect and demeanor with intact judgement and insight   Data Reviewed: Basic Metabolic Panel:  Recent Labs Lab 06/13/14 1100 06/13/14 1712 06/14/14 0455  NA 139  --  135*  K 4.2  --  4.0  CL 102  --  101  CO2 24  --  21  GLUCOSE 86  --  157*  BUN 15  --  13  CREATININE 0.77 0.69 0.64  CALCIUM 9.5  --  9.5   Liver Function Tests:  Recent Labs Lab 06/13/14 1712 06/14/14 0455  AST 15 12  ALT 12 11  ALKPHOS 76 73  BILITOT 0.4 0.2*  PROT 8.0 7.8  ALBUMIN 3.8 3.6   CBC:  Recent Labs Lab 06/13/14 1100 06/13/14 1712 06/14/14 0455  WBC 10.3 10.0 13.6*  HGB  12.0 11.5* 11.4*  HCT 37.8 36.3 35.8*  MCV 77.3* 77.6* 77.3*  PLT 314 270 314   Cardiac Enzymes:  Recent Labs Lab 06/13/14 1712 06/13/14 2157 06/14/14 0455  TROPONINI <0.30 <0.30 <0.30     Studies: Dg Chest 2 View (if Patient Has Fever And/or Copd)  06/13/2014   CLINICAL DATA:  33 year old female with acute cough and shortness of Breath. Initial encounter.  EXAM: CHEST  2 VIEW  COMPARISON:  10/27/2013 and earlier.  FINDINGS: Chronic large body habitus and somewhat low lung volumes. Normal cardiac size and mediastinal contours. Visualized tracheal air column is within normal limits. Aside from crowding of markings, the lungs are clear. No pneumothorax or effusion. Mild scoliosis.  IMPRESSION: Chronically low lung volumes, otherwise no acute  cardiopulmonary abnormality.   Electronically Signed   By: Augusto GambleLee  Hall M.D.   On: 06/13/2014 11:48    Scheduled Meds: . azithromycin  500 mg Intravenous Q24H  . chlorpheniramine-HYDROcodone  5 mL Oral QHS  . enoxaparin (LOVENOX) injection  40 mg Subcutaneous Q24H  . fluticasone  1 spray Each Nare Daily  . ipratropium-albuterol  3 mL Nebulization TID  . loratadine  10 mg Oral Daily  . methylPREDNISolone (SOLU-MEDROL) injection  60 mg Intravenous Q6H  . mometasone-formoterol  2 puff Inhalation BID  . oxymetazoline  1 spray Each Nare BID  . sodium chloride  3 mL Intravenous Q12H   Continuous Infusions: . albuterol Stopped (06/13/14 1545)    Active Problems:   Asthma exacerbation   Acute asthma exacerbation   Elvis Coilebecca Cooper, PA-S2 Algis DownsMarianne York, PA-C [facility] Pager [phone]. If 7PM-7AM, please contact night-coverage at [email or web address], password [email or web address] 06/14/2014, 11:47 AM  LOS: 1 day   Attending Patient was seen, examined,treatment plan was discussed with the  Advance Practice Provider.  I have directly reviewed the clinical findings, lab, imaging studies and management of this patient in detail. I have made the necessary changes to the above noted documentation, and agree with the documentation, as recorded by the Advance Practice Provider.   Admitted with Asthma exac-slowly improving-c/w Steroids, nebs, empiric Abx. Suspect requires additional 24 hours of hospitalization.  Windell NorfolkS Ghimire MD [facility].

## 2014-06-14 NOTE — Progress Notes (Signed)
Utilization review completed.  

## 2014-06-14 NOTE — Care Management Note (Signed)
    Page 1 of 1   06/14/2014     5:54:31 PM CARE MANAGEMENT NOTE 06/14/2014  Patient:  [name],[name]   Account Number:  [id number]  Date Initiated:  06/14/2014  Documentation initiated by:  Letha CapeAYLOR,Roshawn Ayala  Subjective/Objective Assessment:   dx acute asthma ex  admit- lives with spouse.     Action/Plan:   Anticipated DC Date:  06/15/2014   Anticipated DC Plan:  HOME/SELF CARE      DC Planning Services  CM consult      Choice offered to / List presented to:             Status of service:  In process, will continue to follow Medicare Important Message given?  NO (If response is "NO", the following Medicare IM given date fields will be blank) Date Medicare IM given:   Medicare IM given by:   Date Additional Medicare IM given:   Additional Medicare IM given by:    Discharge Disposition:    Per UR Regulation:  Reviewed for med. necessity/level of care/duration of stay  If discussed at Long Length of Stay Meetings, dates discussed:    Comments:  06/14/14 1752 Letha Capeeborah Dalicia Kisner RN, BSN [phone] patient for dc tomorrow, [facility].

## 2014-06-15 LAB — IRON AND TIBC
IRON: 44 ug/dL (ref 42–135)
Saturation Ratios: 12 % — ABNORMAL LOW (ref 20–55)
TIBC: 382 ug/dL (ref 250–470)
UIBC: 338 ug/dL (ref 125–400)

## 2014-06-15 LAB — CBC
HCT: 35.3 % — ABNORMAL LOW (ref 36.0–46.0)
HEMOGLOBIN: 11.3 g/dL — AB (ref 12.0–15.0)
MCH: 25 pg — AB (ref 26.0–34.0)
MCHC: 32 g/dL (ref 30.0–36.0)
MCV: 78.1 fL (ref 78.0–100.0)
Platelets: 332 10*3/uL (ref 150–400)
RBC: 4.52 MIL/uL (ref 3.87–5.11)
RDW: 14.7 % (ref 11.5–15.5)
WBC: 21.2 10*3/uL — ABNORMAL HIGH (ref 4.0–10.5)

## 2014-06-15 LAB — FERRITIN: Ferritin: 16 ng/mL (ref 10–291)

## 2014-06-15 MED ORDER — GUAIFENESIN ER 600 MG PO TB12
1200.0000 mg | ORAL_TABLET | Freq: Two times a day (BID) | ORAL | Status: DC
  Administered 2014-06-15 – 2014-06-16 (×3): 1200 mg via ORAL
  Filled 2014-06-15 (×4): qty 2

## 2014-06-15 MED ORDER — METHYLPREDNISOLONE SODIUM SUCC 40 MG IJ SOLR
40.0000 mg | Freq: Two times a day (BID) | INTRAMUSCULAR | Status: DC
  Administered 2014-06-15 – 2014-06-16 (×2): 40 mg via INTRAVENOUS
  Filled 2014-06-15 (×4): qty 1

## 2014-06-15 MED ORDER — MAGNESIUM SULFATE 40 MG/ML IJ SOLN
2.0000 g | Freq: Once | INTRAMUSCULAR | Status: AC
  Administered 2014-06-15: 2 g via INTRAVENOUS
  Filled 2014-06-15: qty 50

## 2014-06-15 NOTE — Progress Notes (Signed)
Pt hr 113. Elmahi MD made aware. No further orders in place. Will continue to monitor.

## 2014-06-15 NOTE — ED Provider Notes (Signed)
I saw and evaluated the patient, reviewed the resident's note and I agree with the findings and plan.  Pt with asthma, sob and wheezing. bil wheezing. Albuterol and atrovent nebs. Solumedrol.  Improved air exchange, persistent wheezing/dyspnea. Additional nebs. Plan for admission.    Suzi RootsKevin E Halen Antenucci, MD 06/15/14 [phone]

## 2014-06-15 NOTE — Progress Notes (Signed)
PROGRESS NOTE  Malvin Johnsrica P Bischoff ZOX:[id number] DOB: 10/28/1980 DOA: 06/13/2014 PCP: No PCP Per Patient  HPI/Subjective: [name] is a 33 year old female with history of asthma who presented 10/12 with asthma exacerbation with blood in her sputum. She reports 1-2 week history of URI prior to admission, and states that she experiences exacerbations when the weather changes. She uses only an albuterol inhaler at home, and denies needing it often, and reports not needing it once last month. Today she states she feels "like something in sitting on her chest" (she says this is typical when she experiences exacerbation) and continues to feel tightness in her chest and continues to wheeze, which has improved somewhat since yesterday.    Assessment/Plan:  Acute asthma exacerbation  -Continue nebs (changed from Atrovent to DuoNeb), IV solumedrol, Azithromycin (day 3), Dulera  -Continue claritin, flonase, and afrin.  -Start Mucinex 1200 mg BID -CXR clear, Influenza PCR negative  -Patient reports not having PCP for 3 years, [facility]  -Respiratory therapy following  Toothache  -Secondary to multiple broken teeth.  -Continue toradol, norco  -Referral to dentist outpatient   Microcytic anemia  -Mild  -Iron / ferritin studies ordered and pending, will start iron supplementation if necessary.  -Stable for further work up out patient if deemed necessary.   DVT Prophylaxis:  Lovenox  Code Status: Full Family Communication:Patient alert and oriented Disposition Plan: Home when able   Consultants:  Respiratory Therapy  Procedures:  None  Antibiotics: Anti-infectives   Start     Dose/Rate Route Frequency Ordered Stop   06/13/14 1930  azithromycin (ZITHROMAX) 500 mg in dextrose 5 % 250 mL IVPB     500 mg 250 mL/hr over 60 Minutes Intravenous Every 24 hours 06/13/14 1627         Objective: Filed Vitals:   06/14/14 1340 06/14/14 2132 06/14/14  2149 06/15/14 0532  BP: 127/64  135/76 136/78  Pulse: 99  106 93  Temp: 98.3 F (36.8 C)  98 F (36.7 C) 97.9 F (36.6 C)  TempSrc: Oral  Oral Oral  Resp: 18  18 18   Height:      Weight:      SpO2: 94% 97% 97% 92%    Intake/Output Summary (Last 24 hours) at 06/15/14 0838 Last data filed at 06/14/14 0935  Gross per 24 hour  Intake    120 ml  Output      0 ml  Net    120 ml   Filed Weights   06/13/14 1047 06/13/14 1807  Weight: 90.719 kg (200 lb) 108.863 kg (240 lb)    Exam: General: Well developed, obese female lying in bed asleep in  NAD, appears stated age  HEENT:  Anicteic Sclera, MMM. Somewhat poor dentition Cardiovascular: RRR, normal S1 S2 auscultated, no rubs, clicks murmurs or gallops.   Respiratory: Bilateral wheezing, no rales or rhonchi  Abdomen: Soft, nontender, nondistended   Neuro: AAOx3, cranial nerves grossly intact.   Psych: Pleasant patient with normal affect and demeanor, with intact judgement and insight   Data Reviewed: Basic Metabolic Panel:  Recent Labs Lab 06/13/14 1100 06/13/14 1712 06/14/14 0455  NA 139  --  135*  K 4.2  --  4.0  CL 102  --  101  CO2 24  --  21  GLUCOSE 86  --  157*  BUN 15  --  13  CREATININE 0.77 0.69 0.64  CALCIUM 9.5  --  9.5  Liver Function Tests:  Recent Labs Lab 06/13/14 1712 06/14/14 0455  AST 15 12  ALT 12 11  ALKPHOS 76 73  BILITOT 0.4 0.2*  PROT 8.0 7.8  ALBUMIN 3.8 3.6    CBC:  Recent Labs Lab 06/13/14 1100 06/13/14 1712 06/14/14 0455 06/15/14 0533  WBC 10.3 10.0 13.6* 21.2*  HGB 12.0 11.5* 11.4* 11.3*  HCT 37.8 36.3 35.8* 35.3*  MCV 77.3* 77.6* 77.3* 78.1  PLT 314 270 314 332   Cardiac Enzymes:  Recent Labs Lab 06/13/14 1712 06/13/14 2157 06/14/14 0455  TROPONINI <0.30 <0.30 <0.30     Studies: Dg Chest 2 View (if Patient Has Fever And/or Copd)  06/13/2014   CLINICAL DATA:  33 year old female with acute cough and shortness of Breath. Initial encounter.  EXAM: CHEST  2  VIEW  COMPARISON:  10/27/2013 and earlier.  FINDINGS: Chronic large body habitus and somewhat low lung volumes. Normal cardiac size and mediastinal contours. Visualized tracheal air column is within normal limits. Aside from crowding of markings, the lungs are clear. No pneumothorax or effusion. Mild scoliosis.  IMPRESSION: Chronically low lung volumes, otherwise no acute cardiopulmonary abnormality.   Electronically Signed   By: Augusto GambleLee  Hall M.D.   On: 06/13/2014 11:48    Scheduled Meds: . azithromycin  500 mg Intravenous Q24H  . chlorpheniramine-HYDROcodone  5 mL Oral QHS  . enoxaparin (LOVENOX) injection  40 mg Subcutaneous Q24H  . fluticasone  1 spray Each Nare Daily  . ipratropium-albuterol  3 mL Nebulization TID  . loratadine  10 mg Oral Daily  . methylPREDNISolone (SOLU-MEDROL) injection  60 mg Intravenous 3 times per day  . mometasone-formoterol  2 puff Inhalation BID  . oxymetazoline  1 spray Each Nare BID  . sodium chloride  3 mL Intravenous Q12H   Continuous Infusions: . albuterol Stopped (06/13/14 1545)    Active Problems:   Asthma exacerbation   Acute asthma exacerbation   Elvis Coilebecca Cooper, PA-S2   [facility] Pager [phone]. If 7PM-7AM, please contact night-coverage at [email or web address], password [email or web address] 06/15/2014, 8:38 AM  LOS: 2 days    Addendum  Patient seen and examined, chart and data base reviewed.  I agree with the above assessment and plan.  For full details please see Mrs. Elvis Coilebecca Cooper, PA-S2 note.  I have reviewed and amended the above note as appropriate.   Clint LippsMutaz A Ornella Coderre, MD [facility] Pager: [phone] 06/15/2014, 12:47 PM

## 2014-06-15 NOTE — Progress Notes (Signed)
SATURATION QUALIFICATIONS: (This note is used to comply with regulatory documentation for home oxygen)  Patient Saturations on Room Air at Rest = 98%  Patient Saturations on Room Air while Ambulating = 97%  Patient Saturations on 0 Liters of oxygen while Ambulating = not done: pt's level stay above 97% walking   Please briefly explain why patient needs home oxygen: not needed

## 2014-06-16 DIAGNOSIS — K088 Other specified disorders of teeth and supporting structures: Secondary | ICD-10-CM

## 2014-06-16 DIAGNOSIS — K0889 Other specified disorders of teeth and supporting structures: Secondary | ICD-10-CM | POA: Diagnosis present

## 2014-06-16 DIAGNOSIS — D72829 Elevated white blood cell count, unspecified: Secondary | ICD-10-CM

## 2014-06-16 LAB — CBC
HCT: 35.3 % — ABNORMAL LOW (ref 36.0–46.0)
Hemoglobin: 11.1 g/dL — ABNORMAL LOW (ref 12.0–15.0)
MCH: 24.7 pg — ABNORMAL LOW (ref 26.0–34.0)
MCHC: 31.4 g/dL (ref 30.0–36.0)
MCV: 78.6 fL (ref 78.0–100.0)
Platelets: 315 K/uL (ref 150–400)
RBC: 4.49 MIL/uL (ref 3.87–5.11)
RDW: 14.9 % (ref 11.5–15.5)
WBC: 18.6 K/uL — ABNORMAL HIGH (ref 4.0–10.5)

## 2014-06-16 LAB — BASIC METABOLIC PANEL
Anion gap: 11 (ref 5–15)
BUN: 18 mg/dL (ref 6–23)
CALCIUM: 8.9 mg/dL (ref 8.4–10.5)
CO2: 24 mEq/L (ref 19–32)
Chloride: 104 mEq/L (ref 96–112)
Creatinine, Ser: 0.79 mg/dL (ref 0.50–1.10)
GLUCOSE: 111 mg/dL — AB (ref 70–99)
Potassium: 4.7 mEq/L (ref 3.7–5.3)
SODIUM: 139 meq/L (ref 137–147)

## 2014-06-16 MED ORDER — ONDANSETRON HCL 4 MG PO TABS: 4.0000 mg | ORAL_TABLET | Freq: Three times a day (TID) | ORAL | Status: DC | PRN

## 2014-06-16 MED ORDER — HYDROCODONE-ACETAMINOPHEN 5-325 MG PO TABS: 1.0000 | ORAL_TABLET | ORAL | Status: DC | PRN

## 2014-06-16 MED ORDER — GUAIFENESIN ER 600 MG PO TB12: 1200.0000 mg | ORAL_TABLET | Freq: Two times a day (BID) | ORAL | Status: DC

## 2014-06-16 MED ORDER — AZITHROMYCIN 250 MG PO TABS: ORAL_TABLET | ORAL | Status: DC

## 2014-06-16 NOTE — Plan of Care (Signed)
Problem: Discharge Progression Outcomes Goal: Pneumonia vaccine received if indicated Outcome: Not Met (add Reason) Pt refused PNA vaccine

## 2014-06-16 NOTE — Progress Notes (Signed)
Yolanda JohnsErica P Yates to be D/C'd Home per MD order.  Discussed with the patient and all questions fully answered.    Medication List    STOP taking these medications       CHLOR-TABLETS PO      TAKE these medications       albuterol 108 (90 BASE) MCG/ACT inhaler  Commonly known as:  PROVENTIL HFA;VENTOLIN HFA  Inhale 1-2 puffs into the lungs every 6 (six) hours as needed for wheezing or shortness of breath.     azithromycin 250 MG tablet  Commonly known as:  ZITHROMAX  Take 2 tablets in the 1st day then 1 tablet PO daily.     guaiFENesin 600 MG 12 hr tablet  Commonly known as:  MUCINEX  Take 2 tablets (1,200 mg total) by mouth 2 (two) times daily.     HYDROcodone-acetaminophen 5-325 MG per tablet  Commonly known as:  NORCO/VICODIN  Take 1 tablet by mouth every 4 (four) hours as needed for moderate pain or severe pain.     ondansetron 4 MG tablet  Commonly known as:  ZOFRAN  Take 1 tablet (4 mg total) by mouth every 8 (eight) hours as needed for nausea or vomiting.        VVS, Skin clean, dry and intact without evidence of skin break down, no evidence of skin tears noted. IV catheter discontinued intact. Site without signs and symptoms of complications. Dressing and pressure applied.  An After Visit Summary was printed and given to the patient.  D/c education completed with patient/family including follow up instructions, medication list, d/c activities limitations if indicated, with other d/c instructions as indicated by MD - patient able to verbalize understanding, all questions fully answered.   Patient instructed to return to ED, call 911, or call MD for any changes in condition.   Patient escorted via WC, and D/C home via private auto.  Beckey DowningFlores, [name] 06/16/2014 11:34 AM

## 2014-06-16 NOTE — Plan of Care (Signed)
Problem: Discharge Progression Outcomes Goal: Flu vaccine received if indicated Outcome: Not Met (add Reason) Pt refused flu vaccine

## 2014-06-16 NOTE — Discharge Summary (Signed)
Physician Discharge Summary  [name] WUJ:[id number] DOB: 01/15/1981 DOA: 06/13/2014  PCP: No PCP Per Patient  Admit date: 06/13/2014 Discharge date: 06/16/2014  Time spent: 40 minutes  Recommendations for Outpatient Follow-up:  1. Followup with primary care physician within one week. 2. Check CBC to ensure resolution of leukocytosis.  Discharge Diagnoses:  Principal Problem:   Acute asthma exacerbation Active Problems:   Asthma exacerbation   Leukocytosis   Toothache   Discharge Condition: Stable  Diet recommendation: Regular  Filed Weights   06/13/14 1047 06/13/14 1807  Weight: 90.719 kg (200 lb) 108.863 kg (240 lb)    History of present illness:  Patient is a 33 year old female with a history of hypertension, asthma who presents to the emergency department with wheezing not improving with her albuterol. Pt reports being sick for past 2 weeks with cough and cold symptoms. Pt reports coughing up yellow colored mucous and episode of coughing up blood.  She reports her asthma is normally triggered by changes in weather. She has never been admitted or intubated for her asthma. She has not been on steroids recently. She has had a productive cough but no fever. No lower extremity swelling or pain. No prior history of PE or DVT, recent prolonged immobilization, surgery, fracture, exogenous hormone use or tobacco use.  Patient's chest x-ray shows no significant findings. Patient does not appear in respiratory distress this time, Despite 1 hour continuous albuterol and Solu-Medrol, patient still has very significant wheezing bilateral.   Hospital Course:   Acute asthma exacerbation  -Started on nebs (changed from Atrovent to DuoNeb), IV solumedrol, Azithromycin , Dulera.  -Was also on flonase, and afrin.  -Start Mucinex 1200 mg BID  -CXR clear, Influenza PCR negative  -Patient reports not having PCP for 3 years, plan [facility] discharge  -Discharged on  prednisone taper and Mucinex. -Z-Pak added, cannot rule out mild acute bronchitis.  Toothache  -Secondary to multiple broken teeth.  -Needs followup with dentist as outpatient. -Prescription for Norco 20 pills given.  Microcytic anemia  -Mild  -Iron / ferritin studies ordered and pending, will start iron supplementation if necessary.  -Stable for further work up out patient if deemed necessary.  Leukocytosis -This is likely secondary to high dose of steroid patient was getting for her asthma. -On the day of admission her WBC was 10.10 and went all away up to 21.2 because of steroids. -On discharge WBC is 18.6, recheck CBC within one week.  Procedures:  None  Consultations:  None  Discharge Exam: Filed Vitals:   06/16/14 0631  BP: 131/84  Pulse: 77  Temp: 98.2 F (36.8 C)  Resp: 18   General: Alert and awake, oriented x3, not in any acute distress. HEENT: anicteric sclera, pupils reactive to light and accommodation, EOMI CVS: S1-S2 clear, no murmur rubs or gallops Chest: clear to auscultation bilaterally, has faint wheezing on forced expiration Abdomen: soft nontender, nondistended, normal bowel sounds, no organomegaly Extremities: no cyanosis, clubbing or edema noted bilaterally Neuro: Cranial nerves II-XII intact, no focal neurological deficits  Discharge Instructions You were cared for by a hospitalist during your hospital stay. If you have any questions about your discharge medications or the care you received while you were in the hospital after you are discharged, you can call the unit and asked to speak with the hospitalist on call if the hospitalist that took care of you is not available. Once you are discharged, your primary care physician will handle  any further medical issues. Please note that NO REFILLS for any discharge medications will be authorized once you are discharged, as it is imperative that you return to your primary care physician (or establish a  relationship with a primary care physician if you do not have one) for your aftercare needs so that they can reassess your need for medications and monitor your lab values.  Discharge Instructions   Increase activity slowly    Complete by:  As directed           Current Discharge Medication List    START taking these medications   Details  azithromycin (ZITHROMAX) 250 MG tablet Take 2 tablets in the 1st day then 1 tablet PO daily. Qty: 6 each, Refills: 0    guaiFENesin (MUCINEX) 600 MG 12 hr tablet Take 2 tablets (1,200 mg total) by mouth 2 (two) times daily. Qty: 30 tablet, Refills: 0    HYDROcodone-acetaminophen (NORCO/VICODIN) 5-325 MG per tablet Take 1 tablet by mouth every 4 (four) hours as needed for moderate pain or severe pain. Qty: 20 tablet, Refills: 0    ondansetron (ZOFRAN) 4 MG tablet Take 1 tablet (4 mg total) by mouth every 8 (eight) hours as needed for nausea or vomiting. Qty: 20 tablet, Refills: 0      CONTINUE these medications which have NOT CHANGED   Details  albuterol (PROVENTIL HFA;VENTOLIN HFA) 108 (90 BASE) MCG/ACT inhaler Inhale 1-2 puffs into the lungs every 6 (six) hours as needed for wheezing or shortness of breath.      STOP taking these medications     Chlorpheniramine Maleate (CHLOR-TABLETS PO)        No Known Allergies Follow-up Information   Follow up with [facility]     On 06/17/2014. (9 am for hospital follow up)    Contact information:   [address] [place] [id number] [phone]       The results of significant diagnostics from this hospitalization (including imaging, microbiology, ancillary and laboratory) are listed below for reference.    Significant Diagnostic Studies: Dg Chest 2 View (if Patient Has Fever And/or Copd)  06/13/2014   CLINICAL DATA:  33 year old female with acute cough and shortness of Breath. Initial encounter.  EXAM: CHEST  2 VIEW  COMPARISON:  10/27/2013 and  earlier.  FINDINGS: Chronic large body habitus and somewhat low lung volumes. Normal cardiac size and mediastinal contours. Visualized tracheal air column is within normal limits. Aside from crowding of markings, the lungs are clear. No pneumothorax or effusion. Mild scoliosis.  IMPRESSION: Chronically low lung volumes, otherwise no acute cardiopulmonary abnormality.   Electronically Signed   By: Augusto GambleLee  Hall M.D.   On: 06/13/2014 11:48    Microbiology: No results found for this or any previous visit (from the past 240 hour(s)).   Labs: Basic Metabolic Panel:  Recent Labs Lab 06/13/14 1100 06/13/14 1712 06/14/14 0455 06/16/14 0635  NA 139  --  135* 139  K 4.2  --  4.0 4.7  CL 102  --  101 104  CO2 24  --  21 24  GLUCOSE 86  --  157* 111*  BUN 15  --  13 18  CREATININE 0.77 0.69 0.64 0.79  CALCIUM 9.5  --  9.5 8.9   Liver Function Tests:  Recent Labs Lab 06/13/14 1712 06/14/14 0455  AST 15 12  ALT 12 11  ALKPHOS 76 73  BILITOT 0.4 0.2*  PROT 8.0 7.8  ALBUMIN  3.8 3.6   No results found for this basename: LIPASE, AMYLASE,  in the last 168 hours No results found for this basename: AMMONIA,  in the last 168 hours CBC:  Recent Labs Lab 06/13/14 1100 06/13/14 1712 06/14/14 0455 06/15/14 0533 06/16/14 0635  WBC 10.3 10.0 13.6* 21.2* 18.6*  HGB 12.0 11.5* 11.4* 11.3* 11.1*  HCT 37.8 36.3 35.8* 35.3* 35.3*  MCV 77.3* 77.6* 77.3* 78.1 78.6  PLT 314 270 314 332 315   Cardiac Enzymes:  Recent Labs Lab 06/13/14 1712 06/13/14 2157 06/14/14 0455  TROPONINI <0.30 <0.30 <0.30   BNP: BNP (last 3 results) No results found for this basename: PROBNP,  in the last 8760 hours CBG: No results found for this basename: GLUCAP,  in the last 168 hours     Signed:  Skyra Crichlow A  [facility] 06/16/2014, 11:08 AM

## 2014-12-28 ENCOUNTER — Encounter (HOSPITAL_COMMUNITY): Payer: Self-pay | Admitting: Emergency Medicine

## 2014-12-28 ENCOUNTER — Emergency Department (INDEPENDENT_AMBULATORY_CARE_PROVIDER_SITE_OTHER)
Admission: EM | Admit: 2014-12-28 | Discharge: 2014-12-28 | Disposition: A | Discharge status: Home / Self Care | Payer: BLUE CROSS/BLUE SHIELD | Source: Home / Self Care | Attending: Family Medicine | Admitting: Family Medicine

## 2014-12-28 DIAGNOSIS — N390 Urinary tract infection, site not specified: Secondary | ICD-10-CM | POA: Diagnosis not present

## 2014-12-28 LAB — POCT URINALYSIS DIP (DEVICE)
BILIRUBIN URINE: NEGATIVE
Glucose, UA: NEGATIVE mg/dL
KETONES UR: NEGATIVE mg/dL
Nitrite: NEGATIVE
Protein, ur: 100 mg/dL — AB
Specific Gravity, Urine: >=1.03 (ref 1.005–1.030)
Urobilinogen, UA: 0.2 mg/dL (ref 0.0–1.0)
pH: 6.5 (ref 5.0–8.0)

## 2014-12-28 LAB — POCT PREGNANCY, URINE: Preg Test, Ur: NEGATIVE

## 2014-12-28 MED ORDER — CEPHALEXIN 500 MG PO CAPS: 500.0000 mg | ORAL_CAPSULE | Freq: Four times a day (QID) | ORAL | Status: AC

## 2014-12-28 NOTE — ED Notes (Signed)
C/o uti States when urinating she feels burning No tx tried

## 2014-12-28 NOTE — ED Provider Notes (Signed)
CSN: [id number]     Arrival date & time 12/28/14  1152 History   First MD Initiated Contact with Patient 12/28/14 1304     Chief Complaint  Patient presents with  . Urinary Tract Infection   (Consider location/radiation/quality/duration/timing/severity/associated sxs/prior Treatment) HPI Comments: Complaining of dysuria for 1 day, urinary frequency, urinary urgency and disrupted urine flow on occasion. Denies fever or chills.  Patient is a 34 y.o. female presenting with urinary tract infection. The history is provided by the patient.  Urinary Tract Infection This is a new problem. The current episode started yesterday. The problem occurs constantly. The problem has not changed since onset.Pertinent negatives include no chest pain, no abdominal pain and no shortness of breath. Nothing aggravates the symptoms. Nothing relieves the symptoms. She has tried nothing for the symptoms.    Past Medical History  Diagnosis Date  . Asthma   . Hypertension     pregnancy  . Heart murmur 1983  . Anemia    Past Surgical History  Procedure Laterality Date  . Nasal septum surgery  ~ 2001  . Dilation and curettage of uterus  2005    S/P miscarriage   History reviewed. No pertinent family history. History  Substance Use Topics  . Smoking status: Never Smoker   . Smokeless tobacco: Never Used  . Alcohol Use: No   OB History    No data available     Review of Systems  Constitutional: Negative for fever, activity change and fatigue.  HENT: Negative.   Respiratory: Negative for cough and shortness of breath.   Cardiovascular: Negative for chest pain and leg swelling.  Gastrointestinal: Negative.  Negative for abdominal pain.  Genitourinary: Positive for dysuria, urgency, frequency and hematuria. Negative for flank pain.  Psychiatric/Behavioral: Negative.     Allergies  Review of patient's allergies indicates no known allergies.  Home Medications   Prior to Admission medications    Medication Sig Start Date End Date Taking? Authorizing Provider  albuterol (PROVENTIL HFA;VENTOLIN HFA) 108 (90 BASE) MCG/ACT inhaler Inhale 1-2 puffs into the lungs every 6 (six) hours as needed for wheezing or shortness of breath.    Historical Provider, MD  cephALEXin (KEFLEX) 500 MG capsule Take 1 capsule (500 mg total) by mouth 4 (four) times daily. 12/28/14   Hayden Rasmussenavid Omarri Eich, NP   BP 145/96 mmHg  Pulse 82  Temp(Src) 98.4 F (36.9 C) (Oral)  Resp 16  SpO2 96%  LMP 12/28/2014 Physical Exam  Constitutional: She is oriented to person, place, and time. She appears well-developed and well-nourished. No distress.  Neck: Normal range of motion. Neck supple.  Cardiovascular: Normal rate.   Pulmonary/Chest: Effort normal. No respiratory distress.  Musculoskeletal: Normal range of motion. She exhibits no edema.  Neurological: She is alert and oriented to person, place, and time.  Skin: Skin is warm and dry.  Psychiatric: She has a normal mood and affect.  Nursing note and vitals reviewed.   ED Course  Procedures (including critical care time) Labs Review Labs Reviewed  POCT URINALYSIS DIP (DEVICE) - Abnormal; Notable for the following:    Hgb urine dipstick LARGE (*)    Protein, ur 100 (*)    Leukocytes, UA LARGE (*)    All other components within normal limits  URINE CULTURE  POCT PREGNANCY, URINE    Imaging Review No results found.   MDM   1. UTI (lower urinary tract infection)    Drink plenty of fluids Use AZO extracted Keflex as directed Urine  culture pending Follow-up with your doctor as needed or if worsening may return.   Hayden Rasmussen, NP 12/28/14 [phone]

## 2014-12-28 NOTE — Discharge Instructions (Signed)
Urinary Tract Infection Take AZO as directed for symptoms Urinary tract infections (UTIs) can develop anywhere along your urinary tract. Your urinary tract is your body's drainage system for removing wastes and extra water. Your urinary tract includes two kidneys, two ureters, a bladder, and a urethra. Your kidneys are a pair of bean-shaped organs. Each kidney is about the size of your fist. They are located below your ribs, one on each side of your spine. CAUSES Infections are caused by microbes, which are microscopic organisms, including fungi, viruses, and bacteria. These organisms are so small that they can only be seen through a microscope. Bacteria are the microbes that most commonly cause UTIs. SYMPTOMS  Symptoms of UTIs may vary by age and gender of the patient and by the location of the infection. Symptoms in young women typically include a frequent and intense urge to urinate and a painful, burning feeling in the bladder or urethra during urination. Older women and men are more likely to be tired, shaky, and weak and have muscle aches and abdominal pain. A fever may mean the infection is in your kidneys. Other symptoms of a kidney infection include pain in your back or sides below the ribs, nausea, and vomiting. DIAGNOSIS To diagnose a UTI, your caregiver will ask you about your symptoms. Your caregiver also will ask to provide a urine sample. The urine sample will be tested for bacteria and white blood cells. White blood cells are made by your body to help fight infection. TREATMENT  Typically, UTIs can be treated with medication. Because most UTIs are caused by a bacterial infection, they usually can be treated with the use of antibiotics. The choice of antibiotic and length of treatment depend on your symptoms and the type of bacteria causing your infection. HOME CARE INSTRUCTIONS  If you were prescribed antibiotics, take them exactly as your caregiver instructs you. Finish the medication  even if you feel better after you have only taken some of the medication.  Drink enough water and fluids to keep your urine clear or pale yellow.  Avoid caffeine, tea, and carbonated beverages. They tend to irritate your bladder.  Empty your bladder often. Avoid holding urine for long periods of time.  Empty your bladder before and after sexual intercourse.  After a bowel movement, women should cleanse from front to back. Use each tissue only once. SEEK MEDICAL CARE IF:   You have back pain.  You develop a fever.  Your symptoms do not begin to resolve within 3 days. SEEK IMMEDIATE MEDICAL CARE IF:   You have severe back pain or lower abdominal pain.  You develop chills.  You have nausea or vomiting.  You have continued burning or discomfort with urination. MAKE SURE YOU:   Understand these instructions.  Will watch your condition.  Will get help right away if you are not doing well or get worse. Document Released: 05/29/2005 Document Revised: 02/18/2012 Document Reviewed: 09/27/2011 [facility] Patient Information 2015 [employer] [place]. This information is not intended to replace advice given to you by your health care provider. Make sure you discuss any questions you have with your health care provider.

## 2014-12-30 LAB — URINE CULTURE
Colony Count: >=100000
Special Requests: NORMAL

## 2015-01-02 NOTE — ED Notes (Signed)
Final report of UA culture shows proteus , sensitive for Rx written

## 2017-03-22 ENCOUNTER — Emergency Department (HOSPITAL_COMMUNITY)
Admission: EM | Admit: 2017-03-22 | Discharge: 2017-03-22 | Disposition: A | Discharge status: Home / Self Care | Payer: BLUE CROSS/BLUE SHIELD | Attending: Emergency Medicine | Admitting: Emergency Medicine

## 2017-03-22 ENCOUNTER — Encounter (HOSPITAL_COMMUNITY): Payer: Self-pay | Admitting: Emergency Medicine

## 2017-03-22 DIAGNOSIS — R42 Dizziness and giddiness: Secondary | ICD-10-CM | POA: Diagnosis present

## 2017-03-22 DIAGNOSIS — I10 Essential (primary) hypertension: Secondary | ICD-10-CM | POA: Insufficient documentation

## 2017-03-22 DIAGNOSIS — R55 Syncope and collapse: Secondary | ICD-10-CM | POA: Diagnosis not present

## 2017-03-22 DIAGNOSIS — J45909 Unspecified asthma, uncomplicated: Secondary | ICD-10-CM | POA: Diagnosis not present

## 2017-03-22 DIAGNOSIS — Z79899 Other long term (current) drug therapy: Secondary | ICD-10-CM | POA: Insufficient documentation

## 2017-03-22 NOTE — ED Provider Notes (Signed)
[facility]-EMERGENCY DEPT Provider Note   CSN: [id number] Arrival date & time: 03/22/17  [id number]     History   Chief Complaint Chief Complaint  Patient presents with  . Dizziness    HPI [name] is a 36 y.o. female.  36 yo F with a chief complaint of syncope. Patient was at work today she was sitting down and talking to her boss when she felt that her vision was getting blurry and then she woke up with him yelling her name. She denied any chest pain or shortness breath. Denied headaches or neck pain. She denies vaginal bleeding vomiting diarrhea blood in her stool or dark stool. She has no complaints currently. States she was only here because her boss made her come. She says she's been feeling mildly weak for the past couple days.   The history is provided by the patient.  Dizziness  Associated symptoms: no chest pain, no headaches, no nausea, no palpitations, no shortness of breath and no vomiting   Illness  This is a new problem. The current episode started 2 days ago. The problem occurs constantly. The problem has been gradually worsening. Pertinent negatives include no chest pain, no headaches and no shortness of breath. Nothing aggravates the symptoms. Nothing relieves the symptoms. She has tried nothing for the symptoms. The treatment provided no relief.    Past Medical History:  Diagnosis Date  . Anemia   . Asthma   . Heart murmur 1983  . Hypertension    pregnancy    Patient Active Problem List   Diagnosis Date Noted  . Leukocytosis 06/16/2014  . Toothache 06/16/2014  . Asthma exacerbation 06/13/2014  . Acute asthma exacerbation 06/13/2014    Past Surgical History:  Procedure Laterality Date  . DILATION AND CURETTAGE OF UTERUS  2005   S/P miscarriage  . NASAL SEPTUM SURGERY  ~ 2001    OB History    No data available       Home Medications    Prior to Admission medications   Medication Sig Start Date End Date Taking? Authorizing Provider  albuterol  (PROVENTIL HFA;VENTOLIN HFA) 108 (90 BASE) MCG/ACT inhaler Inhale 1-2 puffs into the lungs every 6 (six) hours as needed for wheezing or shortness of breath.    [provider]  cephALEXin (KEFLEX) 500 MG capsule Take 1 capsule (500 mg total) by mouth 4 (four) times daily. 12/28/14   Hayden RasmussenMabe, David, NP    Family History No family history on file.  Social History Social History  Substance Use Topics  . Smoking status: Never Smoker  . Smokeless tobacco: Never Used  . Alcohol use No     Allergies   Patient has no known allergies.   Review of Systems Review of Systems  Constitutional: Negative for chills and fever.  HENT: Negative for congestion and rhinorrhea.   Eyes: Negative for redness and visual disturbance.  Respiratory: Negative for shortness of breath and wheezing.   Cardiovascular: Negative for chest pain and palpitations.  Gastrointestinal: Negative for nausea and vomiting.  Genitourinary: Negative for dysuria and urgency.  Musculoskeletal: Negative for arthralgias and myalgias.  Skin: Negative for pallor and wound.  Neurological: Positive for syncope. Negative for dizziness and headaches.     Physical Exam Updated Vital Signs BP (!) 148/89 (BP Location: Right Arm)   Pulse 71   Temp 98 F (36.7 C) (Oral)   Resp 18   LMP 02/24/2017 (Approximate)   SpO2 100%   Physical Exam  Constitutional:  She is oriented to person, place, and time. She appears well-developed and well-nourished. No distress.  HENT:  Head: Normocephalic and atraumatic.  Eyes: Pupils are equal, round, and reactive to light. EOM are normal.  Neck: Normal range of motion. Neck supple.  Cardiovascular: Normal rate and regular rhythm.  Exam reveals no gallop and no friction rub.   No murmur heard. Pulmonary/Chest: Effort normal. She has no wheezes. She has no rales.  Abdominal: Soft. She exhibits no distension and no mass. There is no tenderness. There is no guarding.  Musculoskeletal: She  exhibits no edema or tenderness.  Friction blisters to the medial aspect of bilateral feet.  Neurological: She is alert and oriented to person, place, and time.  Skin: Skin is warm and dry. She is not diaphoretic.  Psychiatric: She has a normal mood and affect. Her behavior is normal.  Nursing note and vitals reviewed.    ED Treatments / Results  Labs (all labs ordered are listed, but only abnormal results are displayed) Labs Reviewed - No data to display  EKG  EKG Interpretation  Date/Time:  Saturday March 22 2017 05:33:57 EDT Ventricular Rate:  66 PR Interval:    QRS Duration: 81 QT Interval:  424 QTC Calculation: 445 R Axis:   21 Text Interpretation:  Sinus rhythm Ventricular premature complex Consider left atrial enlargement no wpw, prolonged qt or brugada No significant change since last tracing Confirmed by Melene Plan [phone]) on 03/22/2017 5:39:33 AM       Radiology No results found.  Procedures Procedures (including critical care time)  Medications Ordered in ED Medications - No data to display   Initial Impression / Assessment and Plan / ED Course  I have reviewed the triage vital signs and the nursing notes.  Pertinent labs & imaging results that were available during my care of the patient were reviewed by me and considered in my medical decision making (see chart for details).     36 yo F With a chief complaint of syncope. Patient currently has no complaints. Her EKG has no concerning findings. She has a tubal ligation. She is generally healthy and do not see a reason for laboratory evaluation. I doubt that this is PE or other concerning etiology. We'll discharge the patient home. Have read and drink well for the next couple days. PCP follow-up.  5:52 AM:  I have discussed the diagnosis/risks/treatment options with the patient and family and believe the pt to be eligible for discharge home to follow-up with PCP. We also discussed returning to the ED  immediately if new or worsening sx occur. We discussed the sx which are most concerning (e.g., sudden worsening pain, fever, inability to tolerate by mouth ) that necessitate immediate return. Medications administered to the patient during their visit and any new prescriptions provided to the patient are listed below.  Medications given during this visit Medications - No data to display   The patient appears reasonably screen and/or stabilized for discharge and I doubt any other medical condition or other [facility] requiring further screening, evaluation, or treatment in the ED at this time prior to discharge.    Final Clinical Impressions(s) / ED Diagnoses   Final diagnoses:  Syncope and collapse    New Prescriptions New Prescriptions   No medications on file     Melene Plan, DO 03/22/17 1324

## 2017-03-22 NOTE — ED Triage Notes (Signed)
Patient reports dizziness x 4 days ,"blackout " at work this morning , pt. added chest tightness and bilateral feet blisters , respirations unlabored , denies nausea or diaphoresis . Alert and oriented at arrival .

## 2019-01-16 ENCOUNTER — Encounter (HOSPITAL_COMMUNITY): Payer: Self-pay | Admitting: Emergency Medicine

## 2019-01-16 ENCOUNTER — Other Ambulatory Visit: Payer: Self-pay

## 2019-01-16 ENCOUNTER — Emergency Department (HOSPITAL_COMMUNITY)
Admission: EM | Admit: 2019-01-16 | Discharge: 2019-01-16 | Disposition: A | Discharge status: Home / Self Care | Payer: BLUE CROSS/BLUE SHIELD | Attending: Emergency Medicine | Admitting: Emergency Medicine

## 2019-01-16 DIAGNOSIS — J45909 Unspecified asthma, uncomplicated: Secondary | ICD-10-CM | POA: Insufficient documentation

## 2019-01-16 DIAGNOSIS — Z1159 Encounter for screening for other viral diseases: Secondary | ICD-10-CM | POA: Diagnosis not present

## 2019-01-16 DIAGNOSIS — R197 Diarrhea, unspecified: Secondary | ICD-10-CM

## 2019-01-16 DIAGNOSIS — R51 Headache: Secondary | ICD-10-CM | POA: Insufficient documentation

## 2019-01-16 DIAGNOSIS — J069 Acute upper respiratory infection, unspecified: Secondary | ICD-10-CM | POA: Diagnosis not present

## 2019-01-16 DIAGNOSIS — I10 Essential (primary) hypertension: Secondary | ICD-10-CM | POA: Diagnosis not present

## 2019-01-16 DIAGNOSIS — R519 Headache, unspecified: Secondary | ICD-10-CM

## 2019-01-16 MED ORDER — ALBUTEROL SULFATE HFA 108 (90 BASE) MCG/ACT IN AERS: 1.0000 | INHALATION_SPRAY | Freq: Four times a day (QID) | RESPIRATORY_TRACT | 0 refills | Status: DC | PRN

## 2019-01-16 MED ORDER — ALBUTEROL SULFATE HFA 108 (90 BASE) MCG/ACT IN AERS: 1.0000 | INHALATION_SPRAY | Freq: Four times a day (QID) | RESPIRATORY_TRACT | 1 refills | Status: AC | PRN

## 2019-01-16 MED ORDER — LOPERAMIDE HCL 2 MG PO CAPS
2.0000 mg | ORAL_CAPSULE | Freq: Four times a day (QID) | ORAL | 0 refills | Status: AC | PRN
Start: 2019-01-16 — End: ?

## 2019-01-16 MED ORDER — ALBUTEROL SULFATE HFA 108 (90 BASE) MCG/ACT IN AERS: 1.0000 | INHALATION_SPRAY | Freq: Four times a day (QID) | RESPIRATORY_TRACT | 0 refills | Status: AC | PRN

## 2019-01-16 NOTE — ED Triage Notes (Signed)
Pt states she just got her hair done and is having pain to the right head that might be related. Pt denies chest pain. Pt reports she started amoxicillin 2 days ago and started having diarrhea yesterday.

## 2019-01-16 NOTE — Discharge Instructions (Addendum)
Please read attached information. If you experience any new or worsening signs or symptoms please return to the emergency room for evaluation.  Please discontinue using your amoxicillin as this could likely be the source of your diarrhea.  If you develop worsening dental pain swelling or fever please contact your dentist for guidance, if you are unable to be seen please return to the emergency room for repeat evaluation.  If your symptoms persist please follow-up next week with your primary care provider for repeat evaluation.

## 2019-01-16 NOTE — ED Provider Notes (Signed)
[facility] EMERGENCY DEPARTMENT Provider Note   CSN: [id number] Arrival date & time: 01/16/19  4098    History   Chief Complaint Chief Complaint  Patient presents with  . Headache  . Diarrhea    HPI [name] is a 38 y.o. female.     HPI   38 year old female presents today with complaints of diarrhea.  She notes last night she developed nonbloody diarrhea, she notes several episodes throughout the evening.  She notes some very minimal abdominal discomfort nonfocal nonsevere.  She denies any nausea or vomiting denies any fever.  No urinary symptoms.  No normal food or drink, no close sick contacts or recent travel or known covert exposures.  Patient does note that 3 days ago she started amoxicillin for a dental infection, this was prescribed by her dentist.  She notes she is taken amoxicillin in the past without difficulty.  She also notes that she recently got her hair done and feels that the braids are causing a sharp pain in her head.  She notes this is occasional, she is not having pain presently, the pain is located in the right anterior head radiating to the ear.  No associated neurological deficits neck stiffness or fever.  No trauma to the head.  She also notes last night she developed runny nose and minimal nonproductive cough.  No associated shortness of breath.   Past Medical History:  Diagnosis Date  . Anemia   . Asthma   . Heart murmur 1983  . Hypertension    pregnancy    Patient Active Problem List   Diagnosis Date Noted  . Leukocytosis 06/16/2014  . Toothache 06/16/2014  . Asthma exacerbation 06/13/2014  . Acute asthma exacerbation 06/13/2014    Past Surgical History:  Procedure Laterality Date  . DILATION AND CURETTAGE OF UTERUS  2005   S/P miscarriage  . NASAL SEPTUM SURGERY  ~ 2001     OB History   No obstetric history on file.      Home Medications    Prior to Admission medications   Medication Sig Start Date End Date  Taking? Authorizing Provider  albuterol (PROVENTIL HFA;VENTOLIN HFA) 108 (90 BASE) MCG/ACT inhaler Inhale 1-2 puffs into the lungs every 6 (six) hours as needed for wheezing or shortness of breath.    [provider]  cephALEXin (KEFLEX) 500 MG capsule Take 1 capsule (500 mg total) by mouth 4 (four) times daily. 12/28/14   Hayden Rasmussen, NP  loperamide (IMODIUM) 2 MG capsule Take 1 capsule (2 mg total) by mouth 4 (four) times daily as needed for diarrhea or loose stools. 01/16/19   Eyvonne Mechanic, PA-C    Family History No family history on file.  Social History Social History   Tobacco Use  . Smoking status: Never Smoker  . Smokeless tobacco: Never Used  Substance Use Topics  . Alcohol use: No  . Drug use: No     Allergies   Patient has no known allergies.   Review of Systems Review of Systems  All other systems reviewed and are negative.   Physical Exam Updated Vital Signs Pulse 80   Temp 98.6 F (37 C) (Oral)   SpO2 100%   Physical Exam Vitals signs and nursing note reviewed.  Constitutional:      Appearance: She is well-developed.  HENT:     Head: Normocephalic and atraumatic.     Comments: Oral exam with no gumline swelling erythema or fluctuance Eyes:  General: No scleral icterus.       Right eye: No discharge.        Left eye: No discharge.     Conjunctiva/sclera: Conjunctivae normal.     Pupils: Pupils are equal, round, and reactive to light.  Neck:     Musculoskeletal: Normal range of motion.     Vascular: No JVD.     Trachea: No tracheal deviation.  Pulmonary:     Effort: Pulmonary effort is normal.     Breath sounds: No stridor.     Comments: Faint expiratory wheeze bilateral no crackles no respiratory distress Abdominal:     General: There is no distension.     Palpations: Abdomen is soft.     Tenderness: There is no abdominal tenderness.  Neurological:     General: No focal deficit present.     Mental Status: She is alert and  oriented to person, place, and time.     Cranial Nerves: No cranial nerve deficit.     Sensory: No sensory deficit.     Coordination: Coordination normal.  Psychiatric:        Behavior: Behavior normal.        Thought Content: Thought content normal.        Judgment: Judgment normal.     ED Treatments / Results  Labs (all labs ordered are listed, but only abnormal results are displayed) Labs Reviewed - No data to display  EKG None  Radiology No results found.  Procedures Procedures (including critical care time)  Medications Ordered in ED Medications - No data to display   Initial Impression / Assessment and Plan / ED Course  I have reviewed the triage vital signs and the nursing notes.  Pertinent labs & imaging results that were available during my care of the patient were reviewed by me and considered in my medical decision making (see chart for details).        Assessment/Plan: 38 year old female presents today with diarrhea.  Likely secondary to recent antibiotic.  She has no fever severe abdominal pain or bloody diarrhea.  Also question viral etiology given her upper respiratory symptoms as well.  She is well-appearing no acute distress with reassuring vital signs.  She has no significant intraoral swelling.  I find it reasonable to discontinue her amoxicillin in the setting of the diarrhea.  If she develops any worsening dental pain swelling she will contact her dentist for reevaluation and guidance on antibiotics.  She will return to emergency room if she develops any severe pain or infection.  Patient will follow-up as an outpatient with her primary care next week if the diarrhea continues.  Imodium as needed.  Patient also noted a headache.  This is intermittent sharp in nature nonsevere, likely secondary to recent hair braiding.  She has no headache symptoms, no neurological episodes or red flags presently.  She has no signs of acute bacterial intra-abdominal flexion  presently.   Final Clinical Impressions(s) / ED Diagnoses   Final diagnoses:  Diarrhea, unspecified type  Nonintractable episodic headache, unspecified headache type    ED Discharge Orders         Ordered    loperamide (IMODIUM) 2 MG capsule  4 times daily PRN     01/16/19 0955           Eyvonne MechanicHedges, Dao Memmott, PA-C 01/16/19 1120    Cathren LaineSteinl, Kevin, MD 01/16/19 1521

## 2019-01-16 NOTE — ED Notes (Signed)
Pt discusses dc instructions and states  Understanding. Home stable with steady gait and all belongings returned.

## 2019-01-17 LAB — NOVEL CORONAVIRUS, NAA (HOSP ORDER, SEND-OUT TO REF LAB; TAT 18-24 HRS): SARS-CoV-2, NAA: NOT DETECTED

## 2019-05-03 ENCOUNTER — Other Ambulatory Visit: Payer: Self-pay

## 2019-05-03 ENCOUNTER — Ambulatory Visit (HOSPITAL_COMMUNITY)
Admission: EM | Admit: 2019-05-03 | Discharge: 2019-05-03 | Disposition: A | Discharge status: Home / Self Care | Payer: BC Managed Care – PPO | Attending: Internal Medicine | Admitting: Internal Medicine

## 2019-05-03 ENCOUNTER — Encounter (HOSPITAL_COMMUNITY): Payer: Self-pay

## 2019-05-03 DIAGNOSIS — R6889 Other general symptoms and signs: Secondary | ICD-10-CM | POA: Diagnosis present

## 2019-05-03 DIAGNOSIS — G43109 Migraine with aura, not intractable, without status migrainosus: Secondary | ICD-10-CM

## 2019-05-03 DIAGNOSIS — Z1159 Encounter for screening for other viral diseases: Secondary | ICD-10-CM

## 2019-05-03 DIAGNOSIS — R112 Nausea with vomiting, unspecified: Secondary | ICD-10-CM

## 2019-05-03 DIAGNOSIS — Z20828 Contact with and (suspected) exposure to other viral communicable diseases: Secondary | ICD-10-CM | POA: Diagnosis not present

## 2019-05-03 MED ORDER — SUMATRIPTAN SUCCINATE 25 MG PO TABS: 25.0000 mg | ORAL_TABLET | ORAL | 0 refills | Status: AC | PRN

## 2019-05-03 MED ORDER — SUMATRIPTAN SUCCINATE 6 MG/0.5ML ~~LOC~~ SOLN
SUBCUTANEOUS | Status: AC
  Filled 2019-05-03: qty 0.5

## 2019-05-03 MED ORDER — DEXAMETHASONE SODIUM PHOSPHATE 10 MG/ML IJ SOLN
10.0000 mg | Freq: Once | INTRAMUSCULAR | Status: AC
  Administered 2019-05-03: 19:00:00 10 mg via INTRAMUSCULAR

## 2019-05-03 MED ORDER — SUMATRIPTAN SUCCINATE 6 MG/0.5ML ~~LOC~~ SOLN
6.0000 mg | Freq: Once | SUBCUTANEOUS | Status: AC
  Administered 2019-05-03: 6 mg via SUBCUTANEOUS

## 2019-05-03 MED ORDER — DEXAMETHASONE SODIUM PHOSPHATE 10 MG/ML IJ SOLN
INTRAMUSCULAR | Status: AC
  Filled 2019-05-03: qty 1

## 2019-05-03 MED ORDER — ONDANSETRON 4 MG PO TBDP: 4.0000 mg | ORAL_TABLET | Freq: Three times a day (TID) | ORAL | 0 refills | Status: AC | PRN

## 2019-05-03 NOTE — ED Triage Notes (Signed)
Pt cc migraine, dizziness, up set stomach and nausea x 5 days.

## 2019-05-03 NOTE — ED Provider Notes (Signed)
[facility]    CSN: [id number] Arrival date & time: 05/03/19  1707      History   Chief Complaint Chief Complaint  Patient presents with  . Migraine    HPI [name] is a 38 y.o. female with history of asthma-controlled, hypertension-controlled comes to urgent care with 2-day history of nausea, vomiting and headache.  Patient says the symptoms started 2 days ago with a headache.  Headache is throbbing and sharp usually unilateral.  It is associated with floaters in the visual field.  No known aggravating factors.  Has been relieved with a couple of occasions she retreated into a dark quiet place.  No fever, chills, sore throat.  No sick contacts. HPI  Past Medical History:  Diagnosis Date  . Anemia   . Asthma   . Heart murmur 1983  . Hypertension    pregnancy    Patient Active Problem List   Diagnosis Date Noted  . Leukocytosis 06/16/2014  . Toothache 06/16/2014  . Asthma exacerbation 06/13/2014  . Acute asthma exacerbation 06/13/2014    Past Surgical History:  Procedure Laterality Date  . DILATION AND CURETTAGE OF UTERUS  2005   S/P miscarriage  . NASAL SEPTUM SURGERY  ~ 2001    OB History   No obstetric history on file.      Home Medications    Prior to Admission medications   Medication Sig Start Date End Date Taking? Authorizing Provider  albuterol (VENTOLIN HFA) 108 (90 Base) MCG/ACT inhaler Inhale 1-2 puffs into the lungs every 6 (six) hours as needed for wheezing or shortness of breath. 01/16/19   Hedges, Tinnie Gens, PA-C  albuterol (VENTOLIN HFA) 108 (90 Base) MCG/ACT inhaler Inhale 1-2 puffs into the lungs every 6 (six) hours as needed for wheezing or shortness of breath. 01/16/19   Hedges, Tinnie Gens, PA-C  cephALEXin (KEFLEX) 500 MG capsule Take 1 capsule (500 mg total) by mouth 4 (four) times daily. 12/28/14   Hayden Rasmussen, NP  loperamide (IMODIUM) 2 MG capsule Take 1 capsule (2 mg total) by mouth 4 (four) times daily as needed for diarrhea or  loose stools. 01/16/19   Eyvonne Mechanic, PA-C    Family History History reviewed. No pertinent family history.  Social History Social History   Tobacco Use  . Smoking status: Never Smoker  . Smokeless tobacco: Never Used  Substance Use Topics  . Alcohol use: No  . Drug use: No     Allergies   Patient has no known allergies.   Review of Systems Review of Systems  Constitutional: Positive for chills, fatigue and fever. Negative for activity change and diaphoresis.  HENT: Negative for congestion, postnasal drip, sinus pressure, sinus pain and sore throat.   Respiratory: Negative for cough, chest tightness, shortness of breath and wheezing.   Gastrointestinal: Positive for abdominal pain and nausea. Negative for diarrhea.  Endocrine: Negative.   Genitourinary: Negative.   Musculoskeletal: Negative for arthralgias, back pain, joint swelling and myalgias.  Skin: Negative.   Neurological: Positive for dizziness, weakness and headaches.  Psychiatric/Behavioral: Negative for behavioral problems, confusion and decreased concentration.     Physical Exam Triage Vital Signs ED Triage Vitals  Enc Vitals Group     BP 05/03/19 1808 (!) 158/93     Pulse Rate 05/03/19 1808 66     Resp 05/03/19 1808 18     Temp 05/03/19 1808 99.1 F (37.3 C)     Temp Source 05/03/19 1808 Oral     SpO2  05/03/19 1808 100 %     Weight 05/03/19 1806 250 lb (113.4 kg)     Height --      Head Circumference --      Peak Flow --      Pain Score 05/03/19 1806 8     Pain Loc --      Pain Edu? --      Excl. in [place]? --    No data found.  Updated Vital Signs BP (!) 158/93 (BP Location: Right Arm)   Pulse 66   Temp 99.1 F (37.3 C) (Oral)   Resp 18   Wt 113.4 kg   LMP 05/01/2019   SpO2 100%   BMI 42.91 kg/m   Visual Acuity Right Eye Distance:   Left Eye Distance:   Bilateral Distance:    Right Eye Near:   Left Eye Near:    Bilateral Near:     Physical Exam Vitals signs and nursing note  reviewed.  Constitutional:      Appearance: She is obese. She is ill-appearing. She is not toxic-appearing.  HENT:     Right Ear: Tympanic membrane normal.     Left Ear: Tympanic membrane normal.     Nose: No rhinorrhea.     Mouth/Throat:     Mouth: Mucous membranes are moist.     Pharynx: Oropharynx is clear. No oropharyngeal exudate or posterior oropharyngeal erythema.  Neck:     Musculoskeletal: Normal range of motion. No neck rigidity.  Cardiovascular:     Rate and Rhythm: Normal rate and regular rhythm.     Pulses: Normal pulses.     Heart sounds: Normal heart sounds. No murmur.  Pulmonary:     Effort: Pulmonary effort is normal. No respiratory distress.     Breath sounds: Normal breath sounds. No stridor.  Abdominal:     General: Bowel sounds are normal. There is no distension.     Palpations: Abdomen is soft.     Tenderness: There is no guarding.     Hernia: No hernia is present.  Musculoskeletal: Normal range of motion.        General: No swelling or tenderness.  Lymphadenopathy:     Cervical: No cervical adenopathy.  Skin:    General: Skin is warm.     Capillary Refill: Capillary refill takes less than 2 seconds.     Findings: No erythema, lesion or rash.  Neurological:     General: No focal deficit present.     Mental Status: She is alert and oriented to person, place, and time.      UC Treatments / Results  Labs (all labs ordered are listed, but only abnormal results are displayed) Labs Reviewed  NOVEL CORONAVIRUS, NAA (HOSP ORDER, SEND-OUT TO REF LAB; TAT 18-24 HRS)    EKG   Radiology No results found.  Procedures Procedures (including critical care time)  Medications Ordered in UC Medications  dexamethasone (DECADRON) injection 10 mg (10 mg Intramuscular Given 05/03/19 1855)  SUMAtriptan (IMITREX) injection 6 mg (6 mg Subcutaneous Given 05/03/19 1855)  dexamethasone (DECADRON) 10 MG/ML injection (has no administration in time range)  SUMAtriptan  (IMITREX) 6 MG/0.5ML injection (has no administration in time range)    Initial Impression / Assessment and Plan / UC Course  I have reviewed the triage vital signs and the nursing notes.  Pertinent labs & imaging results that were available during my care of the patient were reviewed by me and considered in my medical decision making (see  chart for details).     1.  Acute migraine with aura: Dexamethasone 10 mg IM Imitrex 6 mg subcu x1 dose Imitrex 25 mg orally to be taken at the onset of the headache and may be repeated 2 hours if there is no improvement Zofran ODT as needed for nausea and vomiting  2.  Flulike illness: COVID-19 screening Self-isolation until the results of coverage testing is available If patient's symptoms worsens she is advised to return to urgent care to be reevaluated. Final Clinical Impressions(s) / UC Diagnoses   Final diagnoses:  Migraine with aura and without status migrainosus, not intractable  Flu-like symptoms   Discharge Instructions   None    ED Prescriptions    None     Controlled Substance Prescriptions [place] Controlled Substance Registry consulted? No   Merrilee JanskyLamptey, French Kendra O, MD 05/03/19 [phone]

## 2019-05-05 LAB — NOVEL CORONAVIRUS, NAA (HOSP ORDER, SEND-OUT TO REF LAB; TAT 18-24 HRS): SARS-CoV-2, NAA: NOT DETECTED

## 2019-05-06 ENCOUNTER — Encounter (HOSPITAL_COMMUNITY): Payer: Self-pay
